# Patient Record
Sex: Female | Born: 1945 | Race: White | Hispanic: No | Marital: Single | Smoking: Never smoker
Health system: Southern US, Community
[De-identification: ages and names within clinical notes are randomized; demographics above are authoritative.]

## PROBLEM LIST (undated history)

## (undated) DIAGNOSIS — M797 Fibromyalgia: Secondary | ICD-10-CM

## (undated) DIAGNOSIS — J189 Pneumonia, unspecified organism: Secondary | ICD-10-CM

## (undated) DIAGNOSIS — M1990 Unspecified osteoarthritis, unspecified site: Secondary | ICD-10-CM

## (undated) DIAGNOSIS — E119 Type 2 diabetes mellitus without complications: Secondary | ICD-10-CM

## (undated) DIAGNOSIS — K589 Irritable bowel syndrome without diarrhea: Secondary | ICD-10-CM

## (undated) DIAGNOSIS — I10 Essential (primary) hypertension: Secondary | ICD-10-CM

## (undated) DIAGNOSIS — Z8719 Personal history of other diseases of the digestive system: Secondary | ICD-10-CM

## (undated) DIAGNOSIS — G4730 Sleep apnea, unspecified: Secondary | ICD-10-CM

## (undated) HISTORY — PX: ABDOMINAL HYSTERECTOMY: SHX81

## (undated) HISTORY — PX: TONSILLECTOMY: SUR1361

## (undated) HISTORY — PX: EYE SURGERY: SHX253

## (undated) HISTORY — PX: FRACTURE SURGERY: SHX138

## (undated) HISTORY — PX: COLONOSCOPY: SHX174

---

## 2007-04-10 DIAGNOSIS — E559 Vitamin D deficiency, unspecified: Secondary | ICD-10-CM | POA: Insufficient documentation

## 2007-12-09 DIAGNOSIS — I10 Essential (primary) hypertension: Secondary | ICD-10-CM | POA: Insufficient documentation

## 2007-12-09 DIAGNOSIS — M797 Fibromyalgia: Secondary | ICD-10-CM | POA: Insufficient documentation

## 2011-01-09 DIAGNOSIS — G4700 Insomnia, unspecified: Secondary | ICD-10-CM | POA: Insufficient documentation

## 2011-01-31 DIAGNOSIS — H538 Other visual disturbances: Secondary | ICD-10-CM | POA: Insufficient documentation

## 2011-01-31 DIAGNOSIS — H5340 Unspecified visual field defects: Secondary | ICD-10-CM | POA: Insufficient documentation

## 2011-08-30 DIAGNOSIS — E119 Type 2 diabetes mellitus without complications: Secondary | ICD-10-CM | POA: Insufficient documentation

## 2011-08-30 DIAGNOSIS — M5430 Sciatica, unspecified side: Secondary | ICD-10-CM | POA: Insufficient documentation

## 2011-08-30 DIAGNOSIS — L908 Other atrophic disorders of skin: Secondary | ICD-10-CM | POA: Insufficient documentation

## 2011-08-30 DIAGNOSIS — Z79899 Other long term (current) drug therapy: Secondary | ICD-10-CM | POA: Insufficient documentation

## 2011-11-02 DIAGNOSIS — I2510 Atherosclerotic heart disease of native coronary artery without angina pectoris: Secondary | ICD-10-CM | POA: Insufficient documentation

## 2011-11-02 DIAGNOSIS — I2584 Coronary atherosclerosis due to calcified coronary lesion: Secondary | ICD-10-CM

## 2012-01-02 DIAGNOSIS — I739 Peripheral vascular disease, unspecified: Secondary | ICD-10-CM

## 2012-01-02 DIAGNOSIS — F419 Anxiety disorder, unspecified: Secondary | ICD-10-CM | POA: Insufficient documentation

## 2012-01-02 DIAGNOSIS — E785 Hyperlipidemia, unspecified: Secondary | ICD-10-CM | POA: Insufficient documentation

## 2012-01-02 DIAGNOSIS — M419 Scoliosis, unspecified: Secondary | ICD-10-CM | POA: Insufficient documentation

## 2012-01-02 DIAGNOSIS — I779 Disorder of arteries and arterioles, unspecified: Secondary | ICD-10-CM | POA: Insufficient documentation

## 2012-06-15 DIAGNOSIS — M659 Synovitis and tenosynovitis, unspecified: Secondary | ICD-10-CM | POA: Insufficient documentation

## 2012-06-15 DIAGNOSIS — M109 Gout, unspecified: Secondary | ICD-10-CM | POA: Insufficient documentation

## 2012-07-16 DIAGNOSIS — M545 Low back pain, unspecified: Secondary | ICD-10-CM | POA: Insufficient documentation

## 2012-07-16 DIAGNOSIS — M5030 Other cervical disc degeneration, unspecified cervical region: Secondary | ICD-10-CM | POA: Insufficient documentation

## 2012-07-16 DIAGNOSIS — N289 Disorder of kidney and ureter, unspecified: Secondary | ICD-10-CM | POA: Insufficient documentation

## 2012-07-16 DIAGNOSIS — M542 Cervicalgia: Secondary | ICD-10-CM | POA: Insufficient documentation

## 2012-10-15 DIAGNOSIS — F1190 Opioid use, unspecified, uncomplicated: Secondary | ICD-10-CM | POA: Insufficient documentation

## 2012-10-15 DIAGNOSIS — M9979 Connective tissue and disc stenosis of intervertebral foramina of abdomen and other regions: Secondary | ICD-10-CM | POA: Insufficient documentation

## 2013-01-06 DIAGNOSIS — D649 Anemia, unspecified: Secondary | ICD-10-CM | POA: Insufficient documentation

## 2013-01-06 DIAGNOSIS — S065XAA Traumatic subdural hemorrhage with loss of consciousness status unknown, initial encounter: Secondary | ICD-10-CM | POA: Insufficient documentation

## 2013-01-06 DIAGNOSIS — S065X9A Traumatic subdural hemorrhage with loss of consciousness of unspecified duration, initial encounter: Secondary | ICD-10-CM | POA: Insufficient documentation

## 2013-02-09 DIAGNOSIS — M79673 Pain in unspecified foot: Secondary | ICD-10-CM | POA: Insufficient documentation

## 2013-03-09 DIAGNOSIS — M1990 Unspecified osteoarthritis, unspecified site: Secondary | ICD-10-CM | POA: Insufficient documentation

## 2013-05-06 DIAGNOSIS — M542 Cervicalgia: Secondary | ICD-10-CM | POA: Diagnosis not present

## 2013-05-06 DIAGNOSIS — D638 Anemia in other chronic diseases classified elsewhere: Secondary | ICD-10-CM | POA: Diagnosis not present

## 2013-05-06 DIAGNOSIS — Z79899 Other long term (current) drug therapy: Secondary | ICD-10-CM | POA: Diagnosis not present

## 2013-05-06 DIAGNOSIS — I2584 Coronary atherosclerosis due to calcified coronary lesion: Secondary | ICD-10-CM | POA: Diagnosis not present

## 2013-05-06 DIAGNOSIS — D509 Iron deficiency anemia, unspecified: Secondary | ICD-10-CM | POA: Diagnosis not present

## 2013-05-06 DIAGNOSIS — E119 Type 2 diabetes mellitus without complications: Secondary | ICD-10-CM | POA: Diagnosis not present

## 2013-05-06 DIAGNOSIS — M1990 Unspecified osteoarthritis, unspecified site: Secondary | ICD-10-CM | POA: Diagnosis not present

## 2013-05-06 DIAGNOSIS — G8929 Other chronic pain: Secondary | ICD-10-CM | POA: Diagnosis not present

## 2013-05-06 DIAGNOSIS — I129 Hypertensive chronic kidney disease with stage 1 through stage 4 chronic kidney disease, or unspecified chronic kidney disease: Secondary | ICD-10-CM | POA: Diagnosis not present

## 2013-05-06 DIAGNOSIS — E559 Vitamin D deficiency, unspecified: Secondary | ICD-10-CM | POA: Diagnosis not present

## 2013-05-06 DIAGNOSIS — M545 Low back pain, unspecified: Secondary | ICD-10-CM | POA: Diagnosis not present

## 2013-05-06 DIAGNOSIS — I779 Disorder of arteries and arterioles, unspecified: Secondary | ICD-10-CM | POA: Diagnosis not present

## 2013-05-06 DIAGNOSIS — N189 Chronic kidney disease, unspecified: Secondary | ICD-10-CM | POA: Diagnosis not present

## 2013-05-06 DIAGNOSIS — G4700 Insomnia, unspecified: Secondary | ICD-10-CM | POA: Diagnosis not present

## 2013-05-06 DIAGNOSIS — IMO0001 Reserved for inherently not codable concepts without codable children: Secondary | ICD-10-CM | POA: Diagnosis not present

## 2013-05-06 DIAGNOSIS — M109 Gout, unspecified: Secondary | ICD-10-CM | POA: Diagnosis not present

## 2013-05-06 DIAGNOSIS — I2510 Atherosclerotic heart disease of native coronary artery without angina pectoris: Secondary | ICD-10-CM | POA: Diagnosis not present

## 2013-05-13 DIAGNOSIS — E119 Type 2 diabetes mellitus without complications: Secondary | ICD-10-CM | POA: Diagnosis not present

## 2013-05-13 DIAGNOSIS — N182 Chronic kidney disease, stage 2 (mild): Secondary | ICD-10-CM | POA: Diagnosis not present

## 2013-05-29 DIAGNOSIS — E119 Type 2 diabetes mellitus without complications: Secondary | ICD-10-CM | POA: Diagnosis not present

## 2013-05-29 DIAGNOSIS — F329 Major depressive disorder, single episode, unspecified: Secondary | ICD-10-CM | POA: Diagnosis not present

## 2013-05-29 DIAGNOSIS — R109 Unspecified abdominal pain: Secondary | ICD-10-CM | POA: Diagnosis not present

## 2013-05-29 DIAGNOSIS — M109 Gout, unspecified: Secondary | ICD-10-CM | POA: Diagnosis not present

## 2013-05-29 DIAGNOSIS — Z79899 Other long term (current) drug therapy: Secondary | ICD-10-CM | POA: Diagnosis not present

## 2013-05-29 DIAGNOSIS — E785 Hyperlipidemia, unspecified: Secondary | ICD-10-CM | POA: Diagnosis not present

## 2013-05-29 DIAGNOSIS — F411 Generalized anxiety disorder: Secondary | ICD-10-CM | POA: Diagnosis not present

## 2013-05-29 DIAGNOSIS — F3289 Other specified depressive episodes: Secondary | ICD-10-CM | POA: Diagnosis not present

## 2013-05-29 DIAGNOSIS — N390 Urinary tract infection, site not specified: Secondary | ICD-10-CM | POA: Diagnosis not present

## 2013-05-29 DIAGNOSIS — K219 Gastro-esophageal reflux disease without esophagitis: Secondary | ICD-10-CM | POA: Diagnosis not present

## 2013-05-29 DIAGNOSIS — K5900 Constipation, unspecified: Secondary | ICD-10-CM | POA: Diagnosis not present

## 2013-05-29 DIAGNOSIS — G4733 Obstructive sleep apnea (adult) (pediatric): Secondary | ICD-10-CM | POA: Diagnosis not present

## 2013-05-29 DIAGNOSIS — R1032 Left lower quadrant pain: Secondary | ICD-10-CM | POA: Diagnosis not present

## 2013-05-29 DIAGNOSIS — E876 Hypokalemia: Secondary | ICD-10-CM | POA: Diagnosis not present

## 2013-06-01 DIAGNOSIS — G4700 Insomnia, unspecified: Secondary | ICD-10-CM | POA: Diagnosis not present

## 2013-06-01 DIAGNOSIS — E119 Type 2 diabetes mellitus without complications: Secondary | ICD-10-CM | POA: Diagnosis not present

## 2013-06-01 DIAGNOSIS — F411 Generalized anxiety disorder: Secondary | ICD-10-CM | POA: Diagnosis not present

## 2013-06-28 DIAGNOSIS — Z139 Encounter for screening, unspecified: Secondary | ICD-10-CM | POA: Diagnosis not present

## 2013-06-28 DIAGNOSIS — IMO0001 Reserved for inherently not codable concepts without codable children: Secondary | ICD-10-CM | POA: Diagnosis not present

## 2013-06-28 DIAGNOSIS — Z9181 History of falling: Secondary | ICD-10-CM | POA: Diagnosis not present

## 2013-06-28 DIAGNOSIS — M4120 Other idiopathic scoliosis, site unspecified: Secondary | ICD-10-CM | POA: Diagnosis not present

## 2013-06-28 DIAGNOSIS — R52 Pain, unspecified: Secondary | ICD-10-CM | POA: Diagnosis not present

## 2013-07-09 DIAGNOSIS — I779 Disorder of arteries and arterioles, unspecified: Secondary | ICD-10-CM | POA: Diagnosis not present

## 2013-07-09 DIAGNOSIS — E119 Type 2 diabetes mellitus without complications: Secondary | ICD-10-CM | POA: Diagnosis not present

## 2013-07-09 DIAGNOSIS — E785 Hyperlipidemia, unspecified: Secondary | ICD-10-CM | POA: Diagnosis not present

## 2013-07-09 DIAGNOSIS — I10 Essential (primary) hypertension: Secondary | ICD-10-CM | POA: Diagnosis not present

## 2013-07-09 DIAGNOSIS — Z79899 Other long term (current) drug therapy: Secondary | ICD-10-CM | POA: Diagnosis not present

## 2013-07-13 DIAGNOSIS — Z79899 Other long term (current) drug therapy: Secondary | ICD-10-CM | POA: Diagnosis not present

## 2013-07-13 DIAGNOSIS — R42 Dizziness and giddiness: Secondary | ICD-10-CM | POA: Diagnosis not present

## 2013-07-13 DIAGNOSIS — R269 Unspecified abnormalities of gait and mobility: Secondary | ICD-10-CM | POA: Diagnosis not present

## 2013-07-13 DIAGNOSIS — N189 Chronic kidney disease, unspecified: Secondary | ICD-10-CM | POA: Diagnosis not present

## 2013-07-13 DIAGNOSIS — G40209 Localization-related (focal) (partial) symptomatic epilepsy and epileptic syndromes with complex partial seizures, not intractable, without status epilepticus: Secondary | ICD-10-CM | POA: Diagnosis not present

## 2013-07-13 DIAGNOSIS — G40309 Generalized idiopathic epilepsy and epileptic syndromes, not intractable, without status epilepticus: Secondary | ICD-10-CM | POA: Diagnosis not present

## 2013-07-13 DIAGNOSIS — Z5181 Encounter for therapeutic drug level monitoring: Secondary | ICD-10-CM | POA: Diagnosis not present

## 2013-09-27 DIAGNOSIS — M109 Gout, unspecified: Secondary | ICD-10-CM | POA: Diagnosis not present

## 2013-09-27 DIAGNOSIS — R609 Edema, unspecified: Secondary | ICD-10-CM | POA: Diagnosis not present

## 2013-09-27 DIAGNOSIS — M1A00X0 Idiopathic chronic gout, unspecified site, without tophus (tophi): Secondary | ICD-10-CM | POA: Diagnosis not present

## 2013-09-27 DIAGNOSIS — F411 Generalized anxiety disorder: Secondary | ICD-10-CM | POA: Diagnosis not present

## 2013-09-27 DIAGNOSIS — G43909 Migraine, unspecified, not intractable, without status migrainosus: Secondary | ICD-10-CM | POA: Diagnosis not present

## 2013-09-27 DIAGNOSIS — I10 Essential (primary) hypertension: Secondary | ICD-10-CM | POA: Diagnosis not present

## 2013-09-27 DIAGNOSIS — Z79899 Other long term (current) drug therapy: Secondary | ICD-10-CM | POA: Diagnosis not present

## 2013-09-27 DIAGNOSIS — E785 Hyperlipidemia, unspecified: Secondary | ICD-10-CM | POA: Diagnosis not present

## 2013-09-27 DIAGNOSIS — N289 Disorder of kidney and ureter, unspecified: Secondary | ICD-10-CM | POA: Diagnosis not present

## 2013-09-27 DIAGNOSIS — E119 Type 2 diabetes mellitus without complications: Secondary | ICD-10-CM | POA: Diagnosis not present

## 2013-11-02 DIAGNOSIS — N63 Unspecified lump in unspecified breast: Secondary | ICD-10-CM | POA: Diagnosis not present

## 2013-11-02 DIAGNOSIS — N390 Urinary tract infection, site not specified: Secondary | ICD-10-CM | POA: Diagnosis not present

## 2013-11-03 DIAGNOSIS — N63 Unspecified lump in unspecified breast: Secondary | ICD-10-CM | POA: Diagnosis not present

## 2013-11-08 DIAGNOSIS — IMO0001 Reserved for inherently not codable concepts without codable children: Secondary | ICD-10-CM | POA: Diagnosis not present

## 2013-11-08 DIAGNOSIS — M545 Low back pain, unspecified: Secondary | ICD-10-CM | POA: Diagnosis not present

## 2013-11-08 DIAGNOSIS — M2550 Pain in unspecified joint: Secondary | ICD-10-CM | POA: Diagnosis not present

## 2013-11-08 DIAGNOSIS — M4120 Other idiopathic scoliosis, site unspecified: Secondary | ICD-10-CM | POA: Diagnosis not present

## 2013-12-07 DIAGNOSIS — M549 Dorsalgia, unspecified: Secondary | ICD-10-CM | POA: Diagnosis not present

## 2013-12-07 DIAGNOSIS — M4120 Other idiopathic scoliosis, site unspecified: Secondary | ICD-10-CM | POA: Diagnosis not present

## 2013-12-07 DIAGNOSIS — M545 Low back pain, unspecified: Secondary | ICD-10-CM | POA: Diagnosis not present

## 2013-12-07 DIAGNOSIS — IMO0001 Reserved for inherently not codable concepts without codable children: Secondary | ICD-10-CM | POA: Diagnosis not present

## 2014-01-03 DIAGNOSIS — I10 Essential (primary) hypertension: Secondary | ICD-10-CM | POA: Diagnosis not present

## 2014-01-03 DIAGNOSIS — F411 Generalized anxiety disorder: Secondary | ICD-10-CM | POA: Diagnosis not present

## 2014-01-03 DIAGNOSIS — E119 Type 2 diabetes mellitus without complications: Secondary | ICD-10-CM | POA: Diagnosis not present

## 2014-01-03 DIAGNOSIS — R609 Edema, unspecified: Secondary | ICD-10-CM | POA: Diagnosis not present

## 2014-01-03 DIAGNOSIS — Z23 Encounter for immunization: Secondary | ICD-10-CM | POA: Diagnosis not present

## 2014-01-06 DIAGNOSIS — M549 Dorsalgia, unspecified: Secondary | ICD-10-CM | POA: Diagnosis not present

## 2014-01-06 DIAGNOSIS — M797 Fibromyalgia: Secondary | ICD-10-CM | POA: Diagnosis not present

## 2014-01-06 DIAGNOSIS — M419 Scoliosis, unspecified: Secondary | ICD-10-CM | POA: Diagnosis not present

## 2014-01-06 DIAGNOSIS — M1000 Idiopathic gout, unspecified site: Secondary | ICD-10-CM | POA: Diagnosis not present

## 2014-03-04 DIAGNOSIS — M542 Cervicalgia: Secondary | ICD-10-CM | POA: Diagnosis not present

## 2014-03-04 DIAGNOSIS — M5030 Other cervical disc degeneration, unspecified cervical region: Secondary | ICD-10-CM | POA: Diagnosis not present

## 2014-03-04 DIAGNOSIS — Z79899 Other long term (current) drug therapy: Secondary | ICD-10-CM | POA: Diagnosis not present

## 2014-03-04 DIAGNOSIS — M545 Low back pain: Secondary | ICD-10-CM | POA: Diagnosis not present

## 2014-03-09 DIAGNOSIS — M40295 Other kyphosis, thoracolumbar region: Secondary | ICD-10-CM | POA: Diagnosis not present

## 2014-03-09 DIAGNOSIS — M419 Scoliosis, unspecified: Secondary | ICD-10-CM | POA: Diagnosis not present

## 2014-03-28 ENCOUNTER — Encounter: Payer: Self-pay | Admitting: Sports Medicine

## 2014-03-28 ENCOUNTER — Ambulatory Visit (INDEPENDENT_AMBULATORY_CARE_PROVIDER_SITE_OTHER): Payer: Medicare Other

## 2014-03-28 ENCOUNTER — Ambulatory Visit (INDEPENDENT_AMBULATORY_CARE_PROVIDER_SITE_OTHER): Payer: Medicare Other | Admitting: Sports Medicine

## 2014-03-28 VITALS — BP 132/85 | HR 79 | Ht 63.0 in | Wt 140.0 lb

## 2014-03-28 DIAGNOSIS — M5412 Radiculopathy, cervical region: Secondary | ICD-10-CM

## 2014-03-28 DIAGNOSIS — M47812 Spondylosis without myelopathy or radiculopathy, cervical region: Secondary | ICD-10-CM | POA: Diagnosis not present

## 2014-03-28 DIAGNOSIS — M4120 Other idiopathic scoliosis, site unspecified: Secondary | ICD-10-CM | POA: Insufficient documentation

## 2014-03-28 DIAGNOSIS — G8929 Other chronic pain: Secondary | ICD-10-CM

## 2014-03-28 DIAGNOSIS — M797 Fibromyalgia: Secondary | ICD-10-CM

## 2014-03-28 DIAGNOSIS — M549 Dorsalgia, unspecified: Secondary | ICD-10-CM | POA: Diagnosis not present

## 2014-03-28 DIAGNOSIS — M4186 Other forms of scoliosis, lumbar region: Secondary | ICD-10-CM | POA: Diagnosis not present

## 2014-03-28 DIAGNOSIS — M542 Cervicalgia: Secondary | ICD-10-CM | POA: Diagnosis not present

## 2014-03-28 DIAGNOSIS — M419 Scoliosis, unspecified: Secondary | ICD-10-CM

## 2014-03-28 MED ORDER — MELOXICAM 15 MG PO TABS: ORAL_TABLET | ORAL | Status: DC

## 2014-03-28 MED ORDER — DULOXETINE HCL 30 MG PO CPEP: 30.0000 mg | ORAL_CAPSULE | Freq: Every day | ORAL | Status: DC

## 2014-03-28 MED ORDER — PREDNISONE (PAK) 10 MG PO TABS: ORAL_TABLET | ORAL | Status: DC

## 2014-03-28 NOTE — Assessment & Plan Note (Addendum)
Sounds as though this is degenerative. She does have a DEXA scan coming up with her PCP. Treatment of osteoporosis per primary. She does have some thoracic and lumbar symptoms, we are going to obtain an x-ray and MRI in anticipation of intervention. She has failed physical therapy, steroids, narcotics for years.

## 2014-03-28 NOTE — Progress Notes (Signed)
   Subjective:    I'm seeing this patient as a consultation for:  Kristina Bravo, NP  CC: mid back pain  HPI: This is a pleasant 69 year old female with a relatively recent onset history of kyphoscoliosis.she's had pain for years that she localizes in the midline of the lower cervical spine, with radiation down the left arm in a C6 and C7 distribution. Pain is moderate, persistent, she has been given oxycodone by her primary provider, she did see a rheumatologist who was unhelpful, she was then referred to spine and scoliosis specialist where x-rays were done,and she is seeking a second opinion.  pain is moderate, persistent, she does have a history of fibromyalgia but is not on any specific medications.  Denies any lower extremity symptoms. No trauma.  She is not taking any anti-inflammatories. Prednisone has worked well in the past. She has had formal physical therapy which was ineffective. She does desire to do some aquatic therapy.  Past medical history, Surgical history, Family history not pertinant except as noted below, Social history, Allergies, and medications have been entered into the medical record, reviewed, and no changes needed.   Review of Systems: No headache, visual changes, nausea, vomiting, diarrhea, constipation, dizziness, abdominal pain, skin rash, fevers, chills, night sweats, weight loss, swollen lymph nodes, body aches, joint swelling, muscle aches, chest pain, shortness of breath, mood changes, visual or auditory hallucinations.   Objective:   General: Well Developed, well nourished, and in no acute distress.  Neuro/Psych: Alert and oriented x3, extra-ocular muscles intact, able to move all 4 extremities, sensation grossly intact. Skin: Warm and dry, no rashes noted.  Respiratory: Not using accessory muscles, speaking in full sentences, trachea midline.  Cardiovascular: Pulses palpable, no extremity edema. Abdomen: Does not appear distended. Neck: Negative  spurling's Full neck range of motion Grip strength and sensation normal in bilateral hands Strength good C4 to T1 distribution No sensory change to C4 to T1 Reflexes normal There is significant kyphoscoliosis with a right-sided rib hump.  Impression and Recommendations:   This case required medical decision making of moderate complexity.

## 2014-03-28 NOTE — Assessment & Plan Note (Signed)
She certainly does have severe scoliosis however her pain is likely related to lumbar spondylosis and degenerative disc disease. She does exhibit left C6 and C7 cervical radiculopathy, and most of her pain is in the lower cervical spine in the midline. She has already had physical therapy, steroids, narcotics which we are going to get her off of. There is also an element of myofascial pain syndrome. I'm going to do another course of prednisone, meloxicam,and we are going to obtain x-rays, and an MRI of her cervical, thoracic, and lumbar spine in anticipation of intervention. I would also like her to do some aquatic therapy. Return to go over MRI results for interventional planning.

## 2014-03-28 NOTE — Assessment & Plan Note (Signed)
Starting Cymbalta 

## 2014-03-30 DIAGNOSIS — M8580 Other specified disorders of bone density and structure, unspecified site: Secondary | ICD-10-CM | POA: Diagnosis not present

## 2014-03-30 DIAGNOSIS — Z13820 Encounter for screening for osteoporosis: Secondary | ICD-10-CM | POA: Diagnosis not present

## 2014-04-04 ENCOUNTER — Ambulatory Visit (INDEPENDENT_AMBULATORY_CARE_PROVIDER_SITE_OTHER): Payer: Medicare Other

## 2014-04-04 DIAGNOSIS — M4125 Other idiopathic scoliosis, thoracolumbar region: Secondary | ICD-10-CM

## 2014-04-04 DIAGNOSIS — M4120 Other idiopathic scoliosis, site unspecified: Secondary | ICD-10-CM

## 2014-04-04 DIAGNOSIS — M5124 Other intervertebral disc displacement, thoracic region: Secondary | ICD-10-CM | POA: Diagnosis not present

## 2014-04-04 DIAGNOSIS — M4185 Other forms of scoliosis, thoracolumbar region: Secondary | ICD-10-CM | POA: Diagnosis not present

## 2014-04-04 DIAGNOSIS — M5125 Other intervertebral disc displacement, thoracolumbar region: Secondary | ICD-10-CM

## 2014-04-04 DIAGNOSIS — M5412 Radiculopathy, cervical region: Secondary | ICD-10-CM

## 2014-04-04 DIAGNOSIS — M4124 Other idiopathic scoliosis, thoracic region: Secondary | ICD-10-CM

## 2014-04-04 DIAGNOSIS — M5134 Other intervertebral disc degeneration, thoracic region: Secondary | ICD-10-CM

## 2014-04-04 DIAGNOSIS — M4802 Spinal stenosis, cervical region: Secondary | ICD-10-CM | POA: Diagnosis not present

## 2014-04-04 DIAGNOSIS — M47816 Spondylosis without myelopathy or radiculopathy, lumbar region: Secondary | ICD-10-CM | POA: Diagnosis not present

## 2014-04-04 DIAGNOSIS — M47896 Other spondylosis, lumbar region: Secondary | ICD-10-CM

## 2014-04-04 DIAGNOSIS — M5023 Other cervical disc displacement, cervicothoracic region: Secondary | ICD-10-CM

## 2014-04-04 DIAGNOSIS — M19011 Primary osteoarthritis, right shoulder: Secondary | ICD-10-CM

## 2014-04-04 DIAGNOSIS — M2578 Osteophyte, vertebrae: Secondary | ICD-10-CM

## 2014-04-04 DIAGNOSIS — M25411 Effusion, right shoulder: Secondary | ICD-10-CM

## 2014-04-04 DIAGNOSIS — M47894 Other spondylosis, thoracic region: Secondary | ICD-10-CM

## 2014-04-05 DIAGNOSIS — M5030 Other cervical disc degeneration, unspecified cervical region: Secondary | ICD-10-CM | POA: Diagnosis not present

## 2014-04-05 DIAGNOSIS — M4120 Other idiopathic scoliosis, site unspecified: Secondary | ICD-10-CM | POA: Diagnosis not present

## 2014-04-05 DIAGNOSIS — M5440 Lumbago with sciatica, unspecified side: Secondary | ICD-10-CM | POA: Diagnosis not present

## 2014-04-05 DIAGNOSIS — M797 Fibromyalgia: Secondary | ICD-10-CM | POA: Diagnosis not present

## 2014-04-07 DIAGNOSIS — M4104 Infantile idiopathic scoliosis, thoracic region: Secondary | ICD-10-CM | POA: Diagnosis not present

## 2014-04-07 DIAGNOSIS — M5430 Sciatica, unspecified side: Secondary | ICD-10-CM | POA: Diagnosis not present

## 2014-04-07 DIAGNOSIS — E119 Type 2 diabetes mellitus without complications: Secondary | ICD-10-CM | POA: Diagnosis not present

## 2014-04-07 DIAGNOSIS — M5030 Other cervical disc degeneration, unspecified cervical region: Secondary | ICD-10-CM | POA: Diagnosis not present

## 2014-04-07 DIAGNOSIS — G894 Chronic pain syndrome: Secondary | ICD-10-CM | POA: Diagnosis not present

## 2014-04-07 DIAGNOSIS — M542 Cervicalgia: Secondary | ICD-10-CM | POA: Diagnosis not present

## 2014-04-08 ENCOUNTER — Ambulatory Visit: Payer: TRICARE For Life (TFL) | Admitting: Sports Medicine

## 2014-04-12 ENCOUNTER — Encounter: Payer: Self-pay | Admitting: Sports Medicine

## 2014-04-12 ENCOUNTER — Ambulatory Visit (INDEPENDENT_AMBULATORY_CARE_PROVIDER_SITE_OTHER): Payer: Medicare Other | Admitting: Sports Medicine

## 2014-04-12 VITALS — BP 142/85 | HR 94 | Ht 63.0 in | Wt 144.0 lb

## 2014-04-12 DIAGNOSIS — M5412 Radiculopathy, cervical region: Secondary | ICD-10-CM

## 2014-04-12 DIAGNOSIS — M47816 Spondylosis without myelopathy or radiculopathy, lumbar region: Secondary | ICD-10-CM | POA: Insufficient documentation

## 2014-04-12 DIAGNOSIS — Z13820 Encounter for screening for osteoporosis: Secondary | ICD-10-CM

## 2014-04-12 DIAGNOSIS — M797 Fibromyalgia: Secondary | ICD-10-CM | POA: Diagnosis not present

## 2014-04-12 DIAGNOSIS — M5416 Radiculopathy, lumbar region: Secondary | ICD-10-CM | POA: Diagnosis not present

## 2014-04-12 MED ORDER — VENLAFAXINE HCL ER 37.5 MG PO CP24: ORAL_CAPSULE | ORAL | Status: DC

## 2014-04-12 MED ORDER — VENLAFAXINE HCL ER 75 MG PO CP24: 75.0000 mg | ORAL_CAPSULE | Freq: Every day | ORAL | Status: DC

## 2014-04-12 NOTE — Progress Notes (Signed)
  Subjective:    CC: MRI results  HPI: This is a very pleasant 69 year old female with severe degenerative kyphoscoliosis. She has left-sided cervical radiculopathy. Unfortunately she has been through physical therapy and unfortunately had persistent symptoms.  Medications have also been ineffective. Her pain is localized in the left periscapular region, as well as into the left hand, second and third fingers. We obtained an MRI for interventional injection planning.  Left lumbar radiculopathy: She also has left-sided axial low back pain, she is unable to tell me whether it's worse with flexion, extension, and is unable to tell me palliating or precipitating factors. She does occasionally get radiation down the left leg, and S1 type distribution.  Fibromyalgia: She did get some nausea with Cymbalta.  Past medical history, Surgical history, Family history not pertinant except as noted below, Social history, Allergies, and medications have been entered into the medical record, reviewed, and no changes needed.   Review of Systems: No fevers, chills, night sweats, weight loss, chest pain, or shortness of breath.   Objective:    General: Well Developed, well nourished, and in no acute distress.  Neuro: Alert and oriented x3, extra-ocular muscles intact, sensation grossly intact.  HEENT: Normocephalic, atraumatic, pupils equal round reactive to light, neck supple, no masses, no lymphadenopathy, thyroid nonpalpable.  Skin: Warm and dry, no rashes. Cardiac: Regular rate and rhythm, no murmurs rubs or gallops, no lower extremity edema.  Respiratory: Clear to auscultation bilaterally. Not using accessory muscles, speaking in full sentences.  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 Her thoracic spine which shows a small and single disc protrusion. Lumbar spine shows severe scoliosis with degenerative facet arthritis,  she has multilevel lumbar degenerative disc disease. On further evaluation of her left S1 nerve root, its dural sac does appear to come in contact with a broad-based L5-S1 disc protrusion.  Impression and Recommendations:

## 2014-04-12 NOTE — Assessment & Plan Note (Signed)
Discontinue Cymbalta, excessive nausea.  We are going to switch to Effexor.

## 2014-04-12 NOTE — Assessment & Plan Note (Signed)
Cervical spine MRI does show multilevel disc protrusions with moderate central canal stenosis. She is experiencing left-sided C7 radiculopathy. At this point we are going to proceed with a left-sided C6-C7 interlaminar epidural. Next line return to see me 3 weeks after the injection to evaluate response.

## 2014-04-12 NOTE — Assessment & Plan Note (Signed)
The lumbar spine does have significant and multilevel spondylosis including severe facet arthritis with degenerative scoliosis. There is also multilevel degenerative disc disease. Symptoms do include left-sided axial back pain, it's difficult to determine whether this is disc related or facet-related, Kristina Shea is going to be very aware over the next several weeks as to what positions worsen her pain.  as she does get left-sided S1 distribution radiculopathy we are going to proceed also with a left-sided selective S1 nerve root block.  certainly her lower facets could also be pain generators, and if she does not get an adequate response from the initial epidural in the lumbar spine we will proceed with a  Lumbar facet injection.

## 2014-04-13 LAB — COMPREHENSIVE METABOLIC PANEL WITH GFR
Albumin: 4.2 g/dL (ref 3.5–5.2)
BUN: 28 mg/dL — ABNORMAL HIGH (ref 6–23)
CO2: 28 meq/L (ref 19–32)
Glucose, Bld: 74 mg/dL (ref 70–99)
Potassium: 3.6 meq/L (ref 3.5–5.3)
Sodium: 140 meq/L (ref 135–145)
Total Protein: 6.5 g/dL (ref 6.0–8.3)

## 2014-04-13 LAB — COMPREHENSIVE METABOLIC PANEL
ALT: <8 U/L (ref 0–35)
AST: 13 U/L (ref 0–37)
Alkaline Phosphatase: 74 U/L (ref 39–117)
Calcium: 9.2 mg/dL (ref 8.4–10.5)
Chloride: 102 mEq/L (ref 96–112)
Creat: 1.27 mg/dL — ABNORMAL HIGH (ref 0.50–1.10)
Total Bilirubin: 0.4 mg/dL (ref 0.2–1.2)

## 2014-04-13 LAB — CBC WITH DIFFERENTIAL/PLATELET
Basophils Absolute: 0 10*3/uL (ref 0.0–0.1)
Basophils Relative: 0 % (ref 0–1)
Eosinophils Absolute: 0.2 10*3/uL (ref 0.0–0.7)
Eosinophils Relative: 2 % (ref 0–5)
HCT: 39.6 % (ref 36.0–46.0)
Hemoglobin: 13.2 g/dL (ref 12.0–15.0)
Lymphocytes Relative: 31 % (ref 12–46)
Lymphs Abs: 3.3 K/uL (ref 0.7–4.0)
MCH: 30 pg (ref 26.0–34.0)
MCHC: 33.3 g/dL (ref 30.0–36.0)
MCV: 90 fL (ref 78.0–100.0)
MPV: 10.5 fL (ref 8.6–12.4)
Monocytes Absolute: 0.7 K/uL (ref 0.1–1.0)
Monocytes Relative: 7 % (ref 3–12)
Neutro Abs: 6.4 K/uL (ref 1.7–7.7)
Neutrophils Relative %: 60 % (ref 43–77)
Platelets: 245 10*3/uL (ref 150–400)
RBC: 4.4 MIL/uL (ref 3.87–5.11)
RDW: 14.8 % (ref 11.5–15.5)
WBC: 10.7 10*3/uL — ABNORMAL HIGH (ref 4.0–10.5)

## 2014-04-13 LAB — RHEUMATOID FACTOR: Rheumatoid fact SerPl-aCnc: <10 [IU]/mL (ref <=14)

## 2014-04-13 LAB — CK: Total CK: 42 U/L (ref 7–177)

## 2014-04-13 LAB — ANA: Anti Nuclear Antibody(ANA): NEGATIVE

## 2014-04-13 LAB — URIC ACID: Uric Acid, Serum: 5.5 mg/dL (ref 2.4–7.0)

## 2014-04-13 LAB — SEDIMENTATION RATE: Sed Rate: 1 mm/hr (ref 0–22)

## 2014-04-14 LAB — CYCLIC CITRUL PEPTIDE ANTIBODY, IGG: Cyclic Citrullin Peptide Ab: <2 U/mL (ref 0.0–5.0)

## 2014-04-20 ENCOUNTER — Ambulatory Visit (INDEPENDENT_AMBULATORY_CARE_PROVIDER_SITE_OTHER): Payer: Medicare Other

## 2014-04-20 ENCOUNTER — Ambulatory Visit: Payer: Self-pay | Admitting: Sports Medicine

## 2014-04-20 DIAGNOSIS — M810 Age-related osteoporosis without current pathological fracture: Secondary | ICD-10-CM

## 2014-04-21 ENCOUNTER — Ambulatory Visit (INDEPENDENT_AMBULATORY_CARE_PROVIDER_SITE_OTHER): Payer: Medicare Other | Admitting: Sports Medicine

## 2014-04-21 ENCOUNTER — Encounter: Payer: Self-pay | Admitting: Sports Medicine

## 2014-04-21 VITALS — BP 133/79 | HR 89 | Ht 62.5 in | Wt 143.0 lb

## 2014-04-21 DIAGNOSIS — M810 Age-related osteoporosis without current pathological fracture: Secondary | ICD-10-CM | POA: Insufficient documentation

## 2014-04-21 DIAGNOSIS — M5416 Radiculopathy, lumbar region: Secondary | ICD-10-CM | POA: Diagnosis not present

## 2014-04-21 DIAGNOSIS — M5412 Radiculopathy, cervical region: Secondary | ICD-10-CM

## 2014-04-21 MED ORDER — ALENDRONATE SODIUM 70 MG PO TABS: 70.0000 mg | ORAL_TABLET | ORAL | Status: DC

## 2014-04-21 NOTE — Assessment & Plan Note (Signed)
Also still awaiting cervical epidural.

## 2014-04-21 NOTE — Assessment & Plan Note (Signed)
Has not yet had epidural. We will proceed with a left L5-S1 interlaminar epidural.  Return 3 weeks after to evaluate response.

## 2014-04-21 NOTE — Addendum Note (Signed)
Addended by: Monica BectonHEKKEKANDAM, THOMAS J on: 04/21/2014 10:36 AM   Modules accepted: Orders

## 2014-04-21 NOTE — Progress Notes (Signed)
  Subjective:    CC: Follow-up  HPI: Neck pain: Unfortunately has not yet had her epidurals.  Low back pain: Has not yet had her epidural.  Bone density: Just finished her DEXA scan, she is positive for osteoporosis, amenable to start treatment.  Past medical history, Surgical history, Family history not pertinant except as noted below, Social history, Allergies, and medications have been entered into the medical record, reviewed, and no changes needed.   Review of Systems: No fevers, chills, night sweats, weight loss, chest pain, or shortness of breath.   Objective:    General: Well Developed, well nourished, and in no acute distress.  Neuro: Alert and oriented x3, extra-ocular muscles intact, sensation grossly intact.  HEENT: Normocephalic, atraumatic, pupils equal round reactive to light, neck supple, no masses, no lymphadenopathy, thyroid nonpalpable.  Skin: Warm and dry, no rashes. Cardiac: Regular rate and rhythm, no murmurs rubs or gallops, no lower extremity edema.  Respiratory: Clear to auscultation bilaterally. Not using accessory muscles, speaking in full sentences.  Impression and Recommendations:

## 2014-04-21 NOTE — Assessment & Plan Note (Signed)
Starting Fosamax. 

## 2014-04-25 ENCOUNTER — Ambulatory Visit
Admission: RE | Admit: 2014-04-25 | Discharge: 2014-04-25 | Disposition: A | Discharge status: Home / Self Care | Payer: Medicare Other | Source: Ambulatory Visit | Attending: Sports Medicine | Admitting: Sports Medicine

## 2014-04-25 DIAGNOSIS — M5412 Radiculopathy, cervical region: Secondary | ICD-10-CM | POA: Diagnosis not present

## 2014-04-25 DIAGNOSIS — M5022 Other cervical disc displacement, mid-cervical region: Secondary | ICD-10-CM | POA: Diagnosis not present

## 2014-04-25 MED ORDER — IOHEXOL 300 MG/ML  SOLN
1.0000 mL | Freq: Once | INTRAMUSCULAR | Status: AC | PRN
  Administered 2014-04-25: 1 mL via EPIDURAL

## 2014-04-25 MED ORDER — TRIAMCINOLONE ACETONIDE 40 MG/ML IJ SUSP (RADIOLOGY)
60.0000 mg | Freq: Once | INTRAMUSCULAR | Status: AC
  Administered 2014-04-25: 60 mg via EPIDURAL

## 2014-04-25 NOTE — Discharge Instructions (Signed)

## 2014-05-03 DIAGNOSIS — M542 Cervicalgia: Secondary | ICD-10-CM | POA: Diagnosis not present

## 2014-05-03 DIAGNOSIS — M4104 Infantile idiopathic scoliosis, thoracic region: Secondary | ICD-10-CM | POA: Diagnosis not present

## 2014-05-05 DIAGNOSIS — M4104 Infantile idiopathic scoliosis, thoracic region: Secondary | ICD-10-CM | POA: Diagnosis not present

## 2014-05-05 DIAGNOSIS — M542 Cervicalgia: Secondary | ICD-10-CM | POA: Diagnosis not present

## 2014-05-09 ENCOUNTER — Inpatient Hospital Stay: Admission: RE | Admit: 2014-05-09 | Payer: Medicare Other | Source: Ambulatory Visit

## 2014-05-12 ENCOUNTER — Ambulatory Visit: Payer: Medicare Other | Admitting: Sports Medicine

## 2014-05-12 ENCOUNTER — Ambulatory Visit
Admission: RE | Admit: 2014-05-12 | Discharge: 2014-05-12 | Disposition: A | Discharge status: Home / Self Care | Payer: Medicare Other | Source: Ambulatory Visit | Attending: Sports Medicine | Admitting: Sports Medicine

## 2014-05-12 DIAGNOSIS — M545 Low back pain: Secondary | ICD-10-CM | POA: Diagnosis not present

## 2014-05-12 MED ORDER — IOHEXOL 180 MG/ML  SOLN
2.0000 mL | Freq: Once | INTRAMUSCULAR | Status: AC | PRN
  Administered 2014-05-12: 2 mL via EPIDURAL

## 2014-05-12 MED ORDER — METHYLPREDNISOLONE ACETATE 40 MG/ML INJ SUSP (RADIOLOG
120.0000 mg | Freq: Once | INTRAMUSCULAR | Status: AC
  Administered 2014-05-12: 120 mg via EPIDURAL

## 2014-05-18 ENCOUNTER — Ambulatory Visit: Payer: Medicare Other | Admitting: Sports Medicine

## 2014-05-24 ENCOUNTER — Encounter: Payer: Self-pay | Admitting: Sports Medicine

## 2014-05-24 ENCOUNTER — Ambulatory Visit (INDEPENDENT_AMBULATORY_CARE_PROVIDER_SITE_OTHER): Payer: Medicare Other | Admitting: Sports Medicine

## 2014-05-24 VITALS — BP 143/86 | HR 93 | Ht 62.5 in | Wt 143.0 lb

## 2014-05-24 DIAGNOSIS — M542 Cervicalgia: Secondary | ICD-10-CM | POA: Diagnosis not present

## 2014-05-24 DIAGNOSIS — M5412 Radiculopathy, cervical region: Secondary | ICD-10-CM | POA: Diagnosis not present

## 2014-05-24 DIAGNOSIS — M1000 Idiopathic gout, unspecified site: Secondary | ICD-10-CM | POA: Diagnosis not present

## 2014-05-24 DIAGNOSIS — M5416 Radiculopathy, lumbar region: Secondary | ICD-10-CM | POA: Diagnosis not present

## 2014-05-24 DIAGNOSIS — M4104 Infantile idiopathic scoliosis, thoracic region: Secondary | ICD-10-CM | POA: Diagnosis not present

## 2014-05-24 MED ORDER — ALLOPURINOL 100 MG PO TABS: 100.0000 mg | ORAL_TABLET | Freq: Every day | ORAL | Status: DC

## 2014-05-24 NOTE — Progress Notes (Signed)
  Subjective:    CC:  Follow-up  HPI: Cervical spondylosis: Excellent response with complete pain relief after cervical epidural.  Lumbar spondylosis: No response, not even temporary after a left-sided L5-S1 interlaminar epidural, has persistent pain that she localizes predominantly on the left side with radiation into the posterior thigh. She did have significant and severe bilateral facet spondylosis on the MRI.  Gout: Recently had a flare, most recent uric acid levels were 5.5.  Past medical history, Surgical history, Family history not pertinant except as noted below, Social history, Allergies, and medications have been entered into the medical record, reviewed, and no changes needed.   Review of Systems: No fevers, chills, night sweats, weight loss, chest pain, or shortness of breath.   Objective:    General: Well Developed, well nourished, and in no acute distress.  Neuro: Alert and oriented x3, extra-ocular muscles intact, sensation grossly intact.  HEENT: Normocephalic, atraumatic, pupils equal round reactive to light, neck supple, no masses, no lymphadenopathy, thyroid nonpalpable.  Skin: Warm and dry, no rashes. Cardiac: Regular rate and rhythm, no murmurs rubs or gallops, no lower extremity edema.  Respiratory: Clear to auscultation bilaterally. Not using accessory muscles, speaking in full sentences.  On further review of the MRI we again note moderate to severe dextroscoliosis with moderate degenerative disc disease and L3-S1 facet spondylosis bilaterally, worse on the left side.  Impression and Recommendations:

## 2014-05-24 NOTE — Assessment & Plan Note (Signed)
Completely resolved with cervical epidural.

## 2014-05-24 NOTE — Assessment & Plan Note (Signed)
Lumbar epidural provided no response, not even temporary.  at this point we are going to proceed with multilevel facet injections. Pain is worse on the left side, we are going to do a left-sided L3-4, L4-5, and L5-S1 facet joint injections with special attention paid to concordant pain.

## 2014-05-24 NOTE — Assessment & Plan Note (Signed)
Adding low-dose allopurinol.

## 2014-06-01 ENCOUNTER — Other Ambulatory Visit: Payer: Medicare Other

## 2014-06-02 ENCOUNTER — Other Ambulatory Visit: Payer: Self-pay | Admitting: Sports Medicine

## 2014-06-02 ENCOUNTER — Ambulatory Visit
Admission: RE | Admit: 2014-06-02 | Discharge: 2014-06-02 | Disposition: A | Discharge status: Home / Self Care | Payer: Medicare Other | Source: Ambulatory Visit | Attending: Sports Medicine | Admitting: Sports Medicine

## 2014-06-02 DIAGNOSIS — M47817 Spondylosis without myelopathy or radiculopathy, lumbosacral region: Secondary | ICD-10-CM | POA: Diagnosis not present

## 2014-06-02 DIAGNOSIS — M5416 Radiculopathy, lumbar region: Secondary | ICD-10-CM

## 2014-06-02 MED ORDER — IOHEXOL 180 MG/ML  SOLN
1.0000 mL | Freq: Once | INTRAMUSCULAR | Status: AC | PRN
  Administered 2014-06-02: 1 mL via INTRA_ARTICULAR

## 2014-06-02 MED ORDER — METHYLPREDNISOLONE ACETATE 40 MG/ML INJ SUSP (RADIOLOG
120.0000 mg | Freq: Once | INTRAMUSCULAR | Status: AC
  Administered 2014-06-02: 120 mg via INTRA_ARTICULAR

## 2014-06-02 NOTE — Discharge Instructions (Signed)

## 2014-06-07 ENCOUNTER — Other Ambulatory Visit: Payer: Self-pay | Admitting: Sports Medicine

## 2014-06-16 ENCOUNTER — Ambulatory Visit: Payer: Medicare Other | Admitting: Sports Medicine

## 2014-06-20 ENCOUNTER — Encounter: Payer: Self-pay | Admitting: Sports Medicine

## 2014-06-20 ENCOUNTER — Ambulatory Visit (INDEPENDENT_AMBULATORY_CARE_PROVIDER_SITE_OTHER): Payer: Medicare Other | Admitting: Sports Medicine

## 2014-06-20 VITALS — BP 158/87 | HR 89 | Ht 62.5 in | Wt 144.0 lb

## 2014-06-20 DIAGNOSIS — M5412 Radiculopathy, cervical region: Secondary | ICD-10-CM

## 2014-06-20 DIAGNOSIS — M47816 Spondylosis without myelopathy or radiculopathy, lumbar region: Secondary | ICD-10-CM | POA: Diagnosis not present

## 2014-06-20 MED ORDER — MELOXICAM 15 MG PO TABS: ORAL_TABLET | ORAL | Status: DC

## 2014-06-20 NOTE — Assessment & Plan Note (Signed)
No response to lumbar epidural but did have a fantastic response with almost complete resolution of back pain L3-L4, L4-L5, and L5-S1 facet injections. According pain was noted primarily at the L3-L4 facet injection, she is now very happy with how her back and her neck feels, if she does have recurrence of pain she would be a candidate for an L3-L4 left-sided facet radio frequency ablation if she does well with medial branch blocks. At this point she has no pain so she can come back to see us on an as-needed basis. Injections were performed by Dr. Alfredo BattyMattern, we will use him for further injections in this patient.

## 2014-06-20 NOTE — Progress Notes (Signed)
  Subjective:    CC: Follow-up  HPI: Cervical radiculopathy: Resolved after her epidural.  Low back pain: No response to a lumbar epidural, more recently we proceeded with left-sided L3-L4, L4-L5, and L5-S1 facet injections, she had concordant pain with the left L3-L4 facet injections and reports near complete pain relief at this point, she is happy with results so far, and her meloxicam takes care of the rest of her pain.  Past medical history, Surgical history, Family history not pertinant except as noted below, Social history, Allergies, and medications have been entered into the medical record, reviewed, and no changes needed.   Review of Systems: No fevers, chills, night sweats, weight loss, chest pain, or shortness of breath.   Objective:    General: Well Developed, well nourished, and in no acute distress.  Neuro: Alert and oriented x3, extra-ocular muscles intact, sensation grossly intact.  HEENT: Normocephalic, atraumatic, pupils equal round reactive to light, neck supple, no masses, no lymphadenopathy, thyroid nonpalpable.  Skin: Warm and dry, no rashes. Cardiac: Regular rate and rhythm, no murmurs rubs or gallops, no lower extremity edema.  Respiratory: Clear to auscultation bilaterally. Not using accessory muscles, speaking in full sentences.  Impression and Recommendations:

## 2014-06-20 NOTE — Assessment & Plan Note (Signed)
Continues to be resolved after her epidural.

## 2014-07-12 DIAGNOSIS — I10 Essential (primary) hypertension: Secondary | ICD-10-CM | POA: Diagnosis not present

## 2014-07-12 DIAGNOSIS — E785 Hyperlipidemia, unspecified: Secondary | ICD-10-CM | POA: Diagnosis not present

## 2014-07-12 DIAGNOSIS — E119 Type 2 diabetes mellitus without complications: Secondary | ICD-10-CM | POA: Diagnosis not present

## 2014-07-12 DIAGNOSIS — G894 Chronic pain syndrome: Secondary | ICD-10-CM | POA: Diagnosis not present

## 2014-07-12 DIAGNOSIS — M5030 Other cervical disc degeneration, unspecified cervical region: Secondary | ICD-10-CM | POA: Diagnosis not present

## 2014-08-11 ENCOUNTER — Other Ambulatory Visit: Payer: Self-pay | Admitting: Sports Medicine

## 2014-10-14 ENCOUNTER — Other Ambulatory Visit: Payer: Self-pay | Admitting: Sports Medicine

## 2014-11-04 DIAGNOSIS — S42291A Other displaced fracture of upper end of right humerus, initial encounter for closed fracture: Secondary | ICD-10-CM | POA: Diagnosis not present

## 2014-11-04 DIAGNOSIS — K219 Gastro-esophageal reflux disease without esophagitis: Secondary | ICD-10-CM | POA: Diagnosis not present

## 2014-11-04 DIAGNOSIS — E785 Hyperlipidemia, unspecified: Secondary | ICD-10-CM | POA: Diagnosis not present

## 2014-11-04 DIAGNOSIS — Z79899 Other long term (current) drug therapy: Secondary | ICD-10-CM | POA: Diagnosis not present

## 2014-11-04 DIAGNOSIS — S42211A Unspecified displaced fracture of surgical neck of right humerus, initial encounter for closed fracture: Secondary | ICD-10-CM | POA: Diagnosis not present

## 2014-11-04 DIAGNOSIS — E119 Type 2 diabetes mellitus without complications: Secondary | ICD-10-CM | POA: Diagnosis not present

## 2014-11-04 DIAGNOSIS — F329 Major depressive disorder, single episode, unspecified: Secondary | ICD-10-CM | POA: Diagnosis not present

## 2014-11-04 DIAGNOSIS — W0110XA Fall on same level from slipping, tripping and stumbling with subsequent striking against unspecified object, initial encounter: Secondary | ICD-10-CM | POA: Diagnosis not present

## 2014-11-04 DIAGNOSIS — G4733 Obstructive sleep apnea (adult) (pediatric): Secondary | ICD-10-CM | POA: Diagnosis not present

## 2014-11-04 DIAGNOSIS — F419 Anxiety disorder, unspecified: Secondary | ICD-10-CM | POA: Diagnosis not present

## 2014-11-07 DIAGNOSIS — S42291A Other displaced fracture of upper end of right humerus, initial encounter for closed fracture: Secondary | ICD-10-CM | POA: Diagnosis not present

## 2014-11-09 DIAGNOSIS — S42209A Unspecified fracture of upper end of unspecified humerus, initial encounter for closed fracture: Secondary | ICD-10-CM | POA: Insufficient documentation

## 2014-11-14 DIAGNOSIS — S42291D Other displaced fracture of upper end of right humerus, subsequent encounter for fracture with routine healing: Secondary | ICD-10-CM | POA: Diagnosis not present

## 2014-11-16 ENCOUNTER — Encounter (HOSPITAL_COMMUNITY): Payer: Self-pay | Admitting: *Deleted

## 2014-11-16 ENCOUNTER — Encounter: Payer: Self-pay | Admitting: Sports Medicine

## 2014-11-16 ENCOUNTER — Ambulatory Visit (INDEPENDENT_AMBULATORY_CARE_PROVIDER_SITE_OTHER): Payer: Medicare Other | Admitting: Sports Medicine

## 2014-11-16 ENCOUNTER — Ambulatory Visit (INDEPENDENT_AMBULATORY_CARE_PROVIDER_SITE_OTHER): Payer: Medicare Other

## 2014-11-16 ENCOUNTER — Other Ambulatory Visit: Payer: Self-pay | Admitting: Orthopedic Surgery

## 2014-11-16 VITALS — BP 133/73 | HR 72 | Wt 178.0 lb

## 2014-11-16 DIAGNOSIS — M47816 Spondylosis without myelopathy or radiculopathy, lumbar region: Secondary | ICD-10-CM

## 2014-11-16 DIAGNOSIS — X58XXXA Exposure to other specified factors, initial encounter: Secondary | ICD-10-CM | POA: Diagnosis not present

## 2014-11-16 DIAGNOSIS — S42201A Unspecified fracture of upper end of right humerus, initial encounter for closed fracture: Secondary | ICD-10-CM | POA: Diagnosis not present

## 2014-11-16 DIAGNOSIS — S42291A Other displaced fracture of upper end of right humerus, initial encounter for closed fracture: Secondary | ICD-10-CM | POA: Diagnosis not present

## 2014-11-16 MED ORDER — CEFAZOLIN SODIUM-DEXTROSE 2-3 GM-% IV SOLR
2.0000 g | INTRAVENOUS | Status: AC
  Administered 2014-11-17: 2 g via INTRAVENOUS
  Filled 2014-11-16: qty 50

## 2014-11-16 MED ORDER — HYDROCODONE-ACETAMINOPHEN 10-325 MG PO TABS: 1.0000 | ORAL_TABLET | Freq: Three times a day (TID) | ORAL | Status: DC | PRN

## 2014-11-16 MED ORDER — POVIDONE-IODINE 7.5 % EX SOLN
Freq: Once | CUTANEOUS | Status: DC
  Filled 2014-11-16: qty 118

## 2014-11-16 NOTE — Progress Notes (Signed)
Pt denies any cardiac history, chest pain or sob. 

## 2014-11-16 NOTE — Progress Notes (Signed)
  Subjective:    CC: Right arm pain  HPI: This is a pleasant [age]-year-old female, 2 days ago she fell against the dresser and had immediate pain, swelling, bruising, she was seen in the emergency department where x-rays showed a displaced fracture of the proximal humerus, she then saw Dr. Dewaine Oats with [facility], he opted for conservative measures, she was not happy with her experience. She is here today for further evaluation and definitive treatment. Pain is severe, persistent, she is uncomfortable in the sling, and her pain medication is insufficient.  Past medical history, Surgical history, Family history not pertinant except as noted below, Social history, Allergies, and medications have been entered into the medical record, reviewed, and no changes needed.   Review of Systems: No fevers, chills, night sweats, weight loss, chest pain, or shortness of breath.   Objective:    General: Well Developed, well nourished, and in no acute distress.  Neuro: Alert and oriented x3, extra-ocular muscles intact, sensation grossly intact.  HEENT: Normocephalic, atraumatic, pupils equal round reactive to light, neck supple, no masses, no lymphadenopathy, thyroid nonpalpable.  Skin: Warm and dry, no rashes. Cardiac: Regular rate and rhythm, no murmurs rubs or gallops, no lower extremity edema.  Respiratory: Clear to auscultation bilaterally. Not using accessory muscles, speaking in full sentences. Right shoulder: Tender to palpation over the proximal humerus with significant bruising and swelling, and external rotation to approximately 1.  X-rays reviewed, they do show a 50% displaced proximal humeral fracture with impaction and severe angulation.  Impression and Recommendations:

## 2014-11-16 NOTE — Assessment & Plan Note (Addendum)
3 days post fracture, impacted and comminuted per radiologist report but I have no images to ascertain intra-articular component. We are going to repeat x-rays, and possibly a CT scan today. I'm going to switch her to a new sling, we will probably do 4-6 weeks of immobilization, followed by physical therapy to relieve the expected frozen shoulder. Increasing hydrocodone to 10/325. Return to see me in 3 weeks.  I billed a fracture code for this encounter, all subsequent visits will be post-op checks in the global period.  After discussion with Dr. Ave Filter we will proceed with operative intervention.

## 2014-11-16 NOTE — Assessment & Plan Note (Signed)
Fantastic response with complete resolution of back pain with L3-L4, L4-L5, and L5-S1 facet injections, concordant pain was most noted at the L3-L4 facet. This was approximately 6 months ago, we need to avoid any injections considering her current fracture but after her fracture is healed we can repeat facet injections. She prefers Dr. Alfredo Batty for injections.

## 2014-11-17 ENCOUNTER — Ambulatory Visit (HOSPITAL_COMMUNITY): Payer: Medicare Other

## 2014-11-17 ENCOUNTER — Ambulatory Visit (HOSPITAL_COMMUNITY): Payer: Medicare Other | Admitting: Anesthesiology

## 2014-11-17 ENCOUNTER — Observation Stay (HOSPITAL_COMMUNITY)
Admission: AD | Admit: 2014-11-17 | Discharge: 2014-11-18 | Disposition: A | Discharge status: Home / Self Care | Payer: Medicare Other | Source: Ambulatory Visit | Attending: Orthopedic Surgery | Admitting: Orthopedic Surgery

## 2014-11-17 ENCOUNTER — Encounter (HOSPITAL_COMMUNITY): Payer: Self-pay | Admitting: *Deleted

## 2014-11-17 ENCOUNTER — Encounter (HOSPITAL_COMMUNITY)
Admission: AD | Disposition: A | Discharge status: Home / Self Care | Payer: Self-pay | Source: Ambulatory Visit | Attending: Orthopedic Surgery

## 2014-11-17 DIAGNOSIS — M797 Fibromyalgia: Secondary | ICD-10-CM | POA: Insufficient documentation

## 2014-11-17 DIAGNOSIS — I10 Essential (primary) hypertension: Secondary | ICD-10-CM | POA: Insufficient documentation

## 2014-11-17 DIAGNOSIS — Z79899 Other long term (current) drug therapy: Secondary | ICD-10-CM | POA: Diagnosis not present

## 2014-11-17 DIAGNOSIS — Z9841 Cataract extraction status, right eye: Secondary | ICD-10-CM | POA: Insufficient documentation

## 2014-11-17 DIAGNOSIS — W19XXXA Unspecified fall, initial encounter: Secondary | ICD-10-CM | POA: Insufficient documentation

## 2014-11-17 DIAGNOSIS — Z825 Family history of asthma and other chronic lower respiratory diseases: Secondary | ICD-10-CM | POA: Insufficient documentation

## 2014-11-17 DIAGNOSIS — E119 Type 2 diabetes mellitus without complications: Secondary | ICD-10-CM | POA: Diagnosis not present

## 2014-11-17 DIAGNOSIS — K589 Irritable bowel syndrome without diarrhea: Secondary | ICD-10-CM | POA: Diagnosis not present

## 2014-11-17 DIAGNOSIS — M75101 Unspecified rotator cuff tear or rupture of right shoulder, not specified as traumatic: Secondary | ICD-10-CM | POA: Diagnosis not present

## 2014-11-17 DIAGNOSIS — Z9889 Other specified postprocedural states: Secondary | ICD-10-CM | POA: Diagnosis present

## 2014-11-17 DIAGNOSIS — M1990 Unspecified osteoarthritis, unspecified site: Secondary | ICD-10-CM | POA: Diagnosis not present

## 2014-11-17 DIAGNOSIS — S42211A Unspecified displaced fracture of surgical neck of right humerus, initial encounter for closed fracture: Principal | ICD-10-CM | POA: Insufficient documentation

## 2014-11-17 DIAGNOSIS — Z9842 Cataract extraction status, left eye: Secondary | ICD-10-CM | POA: Diagnosis not present

## 2014-11-17 DIAGNOSIS — S42201A Unspecified fracture of upper end of right humerus, initial encounter for closed fracture: Secondary | ICD-10-CM | POA: Diagnosis not present

## 2014-11-17 DIAGNOSIS — G8918 Other acute postprocedural pain: Secondary | ICD-10-CM | POA: Diagnosis not present

## 2014-11-17 DIAGNOSIS — Z8781 Personal history of (healed) traumatic fracture: Secondary | ICD-10-CM

## 2014-11-17 DIAGNOSIS — K449 Diaphragmatic hernia without obstruction or gangrene: Secondary | ICD-10-CM | POA: Insufficient documentation

## 2014-11-17 DIAGNOSIS — S42291A Other displaced fracture of upper end of right humerus, initial encounter for closed fracture: Secondary | ICD-10-CM | POA: Diagnosis not present

## 2014-11-17 DIAGNOSIS — Z419 Encounter for procedure for purposes other than remedying health state, unspecified: Secondary | ICD-10-CM

## 2014-11-17 HISTORY — DX: Sleep apnea, unspecified: G47.30

## 2014-11-17 HISTORY — DX: Fibromyalgia: M79.7

## 2014-11-17 HISTORY — DX: Irritable bowel syndrome, unspecified: K58.9

## 2014-11-17 HISTORY — DX: Pneumonia, unspecified organism: J18.9

## 2014-11-17 HISTORY — PX: ORIF HUMERUS FRACTURE: SHX2126

## 2014-11-17 HISTORY — DX: Unspecified osteoarthritis, unspecified site: M19.90

## 2014-11-17 HISTORY — DX: Type 2 diabetes mellitus without complications: E11.9

## 2014-11-17 HISTORY — DX: Personal history of other diseases of the digestive system: Z87.19

## 2014-11-17 HISTORY — DX: Essential (primary) hypertension: I10

## 2014-11-17 LAB — BASIC METABOLIC PANEL
ANION GAP: 11 (ref 5–15)
BUN: 19 mg/dL (ref 6–20)
CALCIUM: 9.2 mg/dL (ref 8.9–10.3)
CO2: 26 mmol/L (ref 22–32)
CREATININE: 1.22 mg/dL — AB (ref 0.44–1.00)
Chloride: 102 mmol/L (ref 101–111)
GFR calc Af Amer: 52 mL/min — ABNORMAL LOW (ref >60)
GFR, EST NON AFRICAN AMERICAN: 45 mL/min — AB (ref >60)
GLUCOSE: 102 mg/dL — AB (ref 65–99)
Potassium: 2.7 mmol/L — CL (ref 3.5–5.1)
Sodium: 139 mmol/L (ref 135–145)

## 2014-11-17 LAB — CBC
HCT: 32.7 % — ABNORMAL LOW (ref 36.0–46.0)
HEMOGLOBIN: 10.7 g/dL — AB (ref 12.0–15.0)
MCH: 30 pg (ref 26.0–34.0)
MCHC: 32.7 g/dL (ref 30.0–36.0)
MCV: 91.6 fL (ref 78.0–100.0)
PLATELETS: 323 10*3/uL (ref 150–400)
RBC: 3.57 MIL/uL — ABNORMAL LOW (ref 3.87–5.11)
RDW: 15.1 % (ref 11.5–15.5)
WBC: 5.3 10*3/uL (ref 4.0–10.5)

## 2014-11-17 LAB — GLUCOSE, CAPILLARY
GLUCOSE-CAPILLARY: 92 mg/dL (ref 65–99)
Glucose-Capillary: 110 mg/dL — ABNORMAL HIGH (ref 65–99)
Glucose-Capillary: 92 mg/dL (ref 65–99)

## 2014-11-17 SURGERY — OPEN REDUCTION INTERNAL FIXATION (ORIF) PROXIMAL HUMERUS FRACTURE
Anesthesia: General | Site: Shoulder | Laterality: Right

## 2014-11-17 MED ORDER — LIDOCAINE HCL (CARDIAC) 20 MG/ML IV SOLN
INTRAVENOUS | Status: DC | PRN
  Administered 2014-11-17: 50 mg via INTRAVENOUS

## 2014-11-17 MED ORDER — PRAVASTATIN SODIUM 20 MG PO TABS
20.0000 mg | ORAL_TABLET | Freq: Every day | ORAL | Status: DC
  Administered 2014-11-17: 20 mg via ORAL
  Filled 2014-11-17: qty 1

## 2014-11-17 MED ORDER — POTASSIUM CHLORIDE CRYS ER 20 MEQ PO TBCR
20.0000 meq | EXTENDED_RELEASE_TABLET | Freq: Two times a day (BID) | ORAL | Status: DC
  Administered 2014-11-17: 20 meq via ORAL
  Filled 2014-11-17: qty 1

## 2014-11-17 MED ORDER — VENLAFAXINE HCL ER 75 MG PO CP24
75.0000 mg | ORAL_CAPSULE | Freq: Every day | ORAL | Status: DC
  Administered 2014-11-18: 75 mg via ORAL
  Filled 2014-11-17: qty 1

## 2014-11-17 MED ORDER — FLEET ENEMA 7-19 GM/118ML RE ENEM: 1.0000 | ENEMA | Freq: Once | RECTAL | Status: DC | PRN

## 2014-11-17 MED ORDER — MENTHOL 3 MG MT LOZG: 1.0000 | LOZENGE | OROMUCOSAL | Status: DC | PRN

## 2014-11-17 MED ORDER — FENTANYL CITRATE (PF) 100 MCG/2ML IJ SOLN: 25.0000 ug | INTRAMUSCULAR | Status: DC | PRN

## 2014-11-17 MED ORDER — NEOSTIGMINE METHYLSULFATE 10 MG/10ML IV SOLN
INTRAVENOUS | Status: DC | PRN
  Administered 2014-11-17: 5 mg via INTRAVENOUS

## 2014-11-17 MED ORDER — POLYETHYLENE GLYCOL 3350 17 G PO PACK: 17.0000 g | PACK | Freq: Every day | ORAL | Status: DC | PRN

## 2014-11-17 MED ORDER — DIPHENHYDRAMINE HCL 12.5 MG/5ML PO ELIX: 12.5000 mg | ORAL_SOLUTION | ORAL | Status: DC | PRN

## 2014-11-17 MED ORDER — DEXTROSE 5 % IV SOLN
10.0000 mg | INTRAVENOUS | Status: DC | PRN
  Administered 2014-11-17: 5 ug/min via INTRAVENOUS

## 2014-11-17 MED ORDER — HYDROCHLOROTHIAZIDE 25 MG PO TABS: 12.5000 mg | ORAL_TABLET | Freq: Every day | ORAL | Status: DC

## 2014-11-17 MED ORDER — ZOLPIDEM TARTRATE 5 MG PO TABS: 5.0000 mg | ORAL_TABLET | Freq: Every evening | ORAL | Status: DC | PRN

## 2014-11-17 MED ORDER — MIDAZOLAM HCL 2 MG/2ML IJ SOLN
INTRAMUSCULAR | Status: AC
  Filled 2014-11-17: qty 4

## 2014-11-17 MED ORDER — GLIPIZIDE ER 2.5 MG PO TB24
2.5000 mg | ORAL_TABLET | Freq: Two times a day (BID) | ORAL | Status: DC
  Administered 2014-11-17 – 2014-11-18 (×2): 2.5 mg via ORAL
  Filled 2014-11-17 (×3): qty 1

## 2014-11-17 MED ORDER — ONDANSETRON HCL 4 MG/2ML IJ SOLN: 4.0000 mg | Freq: Once | INTRAMUSCULAR | Status: DC | PRN

## 2014-11-17 MED ORDER — ACETAMINOPHEN 650 MG RE SUPP: 650.0000 mg | Freq: Four times a day (QID) | RECTAL | Status: DC | PRN

## 2014-11-17 MED ORDER — ONDANSETRON HCL 4 MG/2ML IJ SOLN: 4.0000 mg | Freq: Four times a day (QID) | INTRAMUSCULAR | Status: DC | PRN

## 2014-11-17 MED ORDER — OXYCODONE HCL 5 MG PO TABS
5.0000 mg | ORAL_TABLET | ORAL | Status: DC | PRN
  Administered 2014-11-17 – 2014-11-18 (×4): 10 mg via ORAL
  Filled 2014-11-17 (×4): qty 2

## 2014-11-17 MED ORDER — ACETAMINOPHEN 325 MG PO TABS: 650.0000 mg | ORAL_TABLET | Freq: Four times a day (QID) | ORAL | Status: DC | PRN

## 2014-11-17 MED ORDER — OXYCODONE HCL 5 MG PO TABS: 5.0000 mg | ORAL_TABLET | Freq: Once | ORAL | Status: DC | PRN

## 2014-11-17 MED ORDER — CYCLOBENZAPRINE HCL 10 MG PO TABS: 10.0000 mg | ORAL_TABLET | Freq: Three times a day (TID) | ORAL | Status: DC | PRN

## 2014-11-17 MED ORDER — METFORMIN HCL 500 MG PO TABS: 500.0000 mg | ORAL_TABLET | Freq: Two times a day (BID) | ORAL | Status: DC

## 2014-11-17 MED ORDER — SUCCINYLCHOLINE CHLORIDE 20 MG/ML IJ SOLN
INTRAMUSCULAR | Status: DC | PRN
  Administered 2014-11-17: 100 mg via INTRAVENOUS

## 2014-11-17 MED ORDER — METOCLOPRAMIDE HCL 5 MG/ML IJ SOLN
5.0000 mg | Freq: Three times a day (TID) | INTRAMUSCULAR | Status: DC | PRN
Start: 2014-11-17 — End: 2014-11-18

## 2014-11-17 MED ORDER — ALLOPURINOL 100 MG PO TABS
100.0000 mg | ORAL_TABLET | Freq: Every day | ORAL | Status: DC
  Administered 2014-11-18: 100 mg via ORAL
  Filled 2014-11-17: qty 1

## 2014-11-17 MED ORDER — ASPIRIN EC 325 MG PO TBEC
325.0000 mg | DELAYED_RELEASE_TABLET | Freq: Two times a day (BID) | ORAL | Status: DC
  Administered 2014-11-18: 325 mg via ORAL
  Filled 2014-11-17: qty 1

## 2014-11-17 MED ORDER — ALUMINUM HYDROXIDE GEL 320 MG/5ML PO SUSP
15.0000 mL | ORAL | Status: DC | PRN
  Filled 2014-11-17: qty 30

## 2014-11-17 MED ORDER — LACTATED RINGERS IV SOLN
INTRAVENOUS | Status: AC
  Administered 2014-11-17: 1000 mL via INTRAVENOUS
  Filled 2014-11-17: qty 1000

## 2014-11-17 MED ORDER — PROPOFOL 10 MG/ML IV BOLUS
INTRAVENOUS | Status: DC | PRN
  Administered 2014-11-17: 140 mg via INTRAVENOUS

## 2014-11-17 MED ORDER — GLYCOPYRROLATE 0.2 MG/ML IJ SOLN
INTRAMUSCULAR | Status: DC | PRN
  Administered 2014-11-17: 0.6 mg via INTRAVENOUS

## 2014-11-17 MED ORDER — MORPHINE SULFATE (PF) 2 MG/ML IV SOLN: 2.0000 mg | INTRAVENOUS | Status: DC | PRN

## 2014-11-17 MED ORDER — FENTANYL CITRATE (PF) 100 MCG/2ML IJ SOLN
INTRAMUSCULAR | Status: AC
  Administered 2014-11-17: 50 ug
  Filled 2014-11-17: qty 2

## 2014-11-17 MED ORDER — FENTANYL CITRATE (PF) 250 MCG/5ML IJ SOLN
INTRAMUSCULAR | Status: AC
  Filled 2014-11-17: qty 5

## 2014-11-17 MED ORDER — CEFAZOLIN SODIUM 1-5 GM-% IV SOLN
1.0000 g | Freq: Four times a day (QID) | INTRAVENOUS | Status: AC
  Administered 2014-11-17 – 2014-11-18 (×2): 1 g via INTRAVENOUS
  Filled 2014-11-17 (×3): qty 50

## 2014-11-17 MED ORDER — MIDAZOLAM HCL 2 MG/2ML IJ SOLN
INTRAMUSCULAR | Status: AC
  Filled 2014-11-17: qty 2

## 2014-11-17 MED ORDER — ALENDRONATE SODIUM 70 MG PO TABS: 70.0000 mg | ORAL_TABLET | ORAL | Status: DC

## 2014-11-17 MED ORDER — LACTATED RINGERS IV SOLN
INTRAVENOUS | Status: DC
  Administered 2014-11-17: 13:00:00 via INTRAVENOUS

## 2014-11-17 MED ORDER — METOCLOPRAMIDE HCL 5 MG PO TABS: 5.0000 mg | ORAL_TABLET | Freq: Three times a day (TID) | ORAL | Status: DC | PRN

## 2014-11-17 MED ORDER — MIDAZOLAM HCL 2 MG/2ML IJ SOLN
2.0000 mg | Freq: Once | INTRAMUSCULAR | Status: AC
  Administered 2014-11-17: 0.5 mg via INTRAVENOUS

## 2014-11-17 MED ORDER — ROCURONIUM BROMIDE 100 MG/10ML IV SOLN
INTRAVENOUS | Status: DC | PRN
  Administered 2014-11-17: 30 mg via INTRAVENOUS

## 2014-11-17 MED ORDER — POTASSIUM CHLORIDE CRYS ER 20 MEQ PO TBCR: 20.0000 meq | EXTENDED_RELEASE_TABLET | Freq: Every day | ORAL | Status: DC

## 2014-11-17 MED ORDER — INSULIN ASPART 100 UNIT/ML ~~LOC~~ SOLN: 0.0000 [IU] | Freq: Three times a day (TID) | SUBCUTANEOUS | Status: DC

## 2014-11-17 MED ORDER — 0.9 % SODIUM CHLORIDE (POUR BTL) OPTIME
TOPICAL | Status: DC | PRN
  Administered 2014-11-17: 1000 mL

## 2014-11-17 MED ORDER — BISACODYL 5 MG PO TBEC: 5.0000 mg | DELAYED_RELEASE_TABLET | Freq: Every day | ORAL | Status: DC | PRN

## 2014-11-17 MED ORDER — DOCUSATE SODIUM 100 MG PO CAPS
100.0000 mg | ORAL_CAPSULE | Freq: Two times a day (BID) | ORAL | Status: DC
  Administered 2014-11-17 – 2014-11-18 (×2): 100 mg via ORAL
  Filled 2014-11-17 (×2): qty 1

## 2014-11-17 MED ORDER — ACETAMINOPHEN 500 MG PO TABS
1000.0000 mg | ORAL_TABLET | Freq: Four times a day (QID) | ORAL | Status: DC
  Administered 2014-11-18 (×3): 1000 mg via ORAL
  Filled 2014-11-17 (×3): qty 2

## 2014-11-17 MED ORDER — TOPIRAMATE 100 MG PO TABS
100.0000 mg | ORAL_TABLET | Freq: Two times a day (BID) | ORAL | Status: DC
  Administered 2014-11-17 – 2014-11-18 (×2): 100 mg via ORAL
  Filled 2014-11-17 (×2): qty 1

## 2014-11-17 MED ORDER — PHENOL 1.4 % MT LIQD: 1.0000 | OROMUCOSAL | Status: DC | PRN

## 2014-11-17 MED ORDER — ONDANSETRON HCL 4 MG PO TABS: 4.0000 mg | ORAL_TABLET | Freq: Four times a day (QID) | ORAL | Status: DC | PRN

## 2014-11-17 MED ORDER — OXYCODONE HCL 5 MG/5ML PO SOLN: 5.0000 mg | Freq: Once | ORAL | Status: DC | PRN

## 2014-11-17 MED ORDER — ONDANSETRON HCL 4 MG/2ML IJ SOLN
INTRAMUSCULAR | Status: DC | PRN
  Administered 2014-11-17: 4 mg via INTRAVENOUS

## 2014-11-17 MED ORDER — FENTANYL CITRATE (PF) 100 MCG/2ML IJ SOLN
100.0000 ug | Freq: Once | INTRAMUSCULAR | Status: AC
  Administered 2014-11-17: 50 ug via INTRAVENOUS

## 2014-11-17 MED ORDER — PROPOFOL 10 MG/ML IV BOLUS
INTRAVENOUS | Status: AC
  Filled 2014-11-17: qty 20

## 2014-11-17 MED ORDER — SODIUM CHLORIDE 0.9 % IV SOLN
INTRAVENOUS | Status: DC
  Administered 2014-11-17: 21:00:00 via INTRAVENOUS

## 2014-11-17 SURGICAL SUPPLY — 68 items
BIT DRILL 3.2 (BIT) ×4
BIT DRILL 3.2XCALB NS DISP (BIT) ×2 IMPLANT
BIT DRILL CALIBRATED 2.7 (BIT) ×2 IMPLANT
BIT DRILL CALIBRATED 2.7MM (BIT) ×1
BIT DRL 3.2XCALB NS DISP (BIT) ×2
CHLORAPREP W/TINT 26ML (MISCELLANEOUS) ×3 IMPLANT
CLOSURE WOUND 1/2 X4 (GAUZE/BANDAGES/DRESSINGS) ×1
COVER SURGICAL LIGHT HANDLE (MISCELLANEOUS) ×3 IMPLANT
DRAPE C-ARM 42X72 X-RAY (DRAPES) ×3 IMPLANT
DRAPE IMP U-DRAPE 54X76 (DRAPES) ×3 IMPLANT
DRAPE INCISE IOBAN 66X45 STRL (DRAPES) ×3 IMPLANT
DRAPE PROXIMA HALF (DRAPES) ×3 IMPLANT
DRAPE SURG 17X23 STRL (DRAPES) ×3 IMPLANT
DRAPE U-SHAPE 47X51 STRL (DRAPES) ×3 IMPLANT
DRSG AQUACEL AG ADV 3.5X10 (GAUZE/BANDAGES/DRESSINGS) ×3 IMPLANT
DRSG EMULSION OIL 3X3 NADH (GAUZE/BANDAGES/DRESSINGS) ×3 IMPLANT
DRSG MEPILEX BORDER 4X8 (GAUZE/BANDAGES/DRESSINGS) IMPLANT
DRSG PAD ABDOMINAL 8X10 ST (GAUZE/BANDAGES/DRESSINGS) ×3 IMPLANT
ELECT REM PT RETURN 9FT ADLT (ELECTROSURGICAL) ×3
ELECTRODE REM PT RTRN 9FT ADLT (ELECTROSURGICAL) ×1 IMPLANT
GAUZE SPONGE 4X4 12PLY STRL (GAUZE/BANDAGES/DRESSINGS) IMPLANT
GLOVE BIO SURGEON STRL SZ7 (GLOVE) ×3 IMPLANT
GLOVE BIO SURGEON STRL SZ7.5 (GLOVE) ×3 IMPLANT
GLOVE BIOGEL PI IND STRL 7.0 (GLOVE) ×1 IMPLANT
GLOVE BIOGEL PI IND STRL 8 (GLOVE) ×1 IMPLANT
GLOVE BIOGEL PI INDICATOR 7.0 (GLOVE) ×2
GLOVE BIOGEL PI INDICATOR 8 (GLOVE) ×2
GOWN STRL REUS W/ TWL LRG LVL3 (GOWN DISPOSABLE) ×3 IMPLANT
GOWN STRL REUS W/ TWL XL LVL3 (GOWN DISPOSABLE) ×1 IMPLANT
GOWN STRL REUS W/TWL LRG LVL3 (GOWN DISPOSABLE) ×6
GOWN STRL REUS W/TWL XL LVL3 (GOWN DISPOSABLE) ×2
K-WIRE 2X5 SS THRDED S3 (WIRE) ×3
KIT BASIN OR (CUSTOM PROCEDURE TRAY) ×3 IMPLANT
KIT ROOM TURNOVER OR (KITS) ×3 IMPLANT
KWIRE 2X5 SS THRDED S3 (WIRE) ×1 IMPLANT
MANIFOLD NEPTUNE II (INSTRUMENTS) ×3 IMPLANT
NDL SUT .5 MAYO 1.404X.05X (NEEDLE) ×1 IMPLANT
NDL SUT 2 .5 CRC MAYO 1.732X (NEEDLE) ×1 IMPLANT
NEEDLE 22X1 1/2 (OR ONLY) (NEEDLE) IMPLANT
NEEDLE MAYO TAPER (NEEDLE) ×4
NS IRRIG 1000ML POUR BTL (IV SOLUTION) ×3 IMPLANT
PACK SHOULDER (CUSTOM PROCEDURE TRAY) ×3 IMPLANT
PACK UNIVERSAL I (CUSTOM PROCEDURE TRAY) ×3 IMPLANT
PAD ARMBOARD 7.5X6 YLW CONV (MISCELLANEOUS) ×6 IMPLANT
PEG LOCKING 3.2MMX54 (Peg) ×3 IMPLANT
PEG LOCKING 3.2X34 (Screw) ×3 IMPLANT
PEG LOCKING 3.2X38 (Screw) ×3 IMPLANT
PEG LOCKING 3.2X40 (Peg) ×3 IMPLANT
PEG LOCKING 3.2X42 (Screw) ×6 IMPLANT
PLATE 3HOLE HUMERUS PROX RT (Plate) ×3 IMPLANT
SCREW LOW PROF TIS 3.5X28MM (Screw) ×3 IMPLANT
SCREW LP NL T15 3.5X24 (Screw) ×6 IMPLANT
SCREW LP NL T15 3.5X26 (Screw) ×3 IMPLANT
SLEEVE MEASURING 3.2 (BIT) ×3 IMPLANT
SPONGE LAP 18X18 X RAY DECT (DISPOSABLE) ×6 IMPLANT
STAPLER VISISTAT 35W (STAPLE) ×3 IMPLANT
STRIP CLOSURE SKIN 1/2X4 (GAUZE/BANDAGES/DRESSINGS) ×2 IMPLANT
SUCTION FRAZIER TIP 10 FR DISP (SUCTIONS) ×3 IMPLANT
SUPPORT WRAP ARM LG (MISCELLANEOUS) ×3 IMPLANT
SUT BONE WAX W31G (SUTURE) IMPLANT
SUT FIBERWIRE #2 38 T-5 BLUE (SUTURE) ×12
SUT VIC AB 2-0 CT1 27 (SUTURE) ×4
SUT VIC AB 2-0 CT1 TAPERPNT 27 (SUTURE) ×2 IMPLANT
SUTURE FIBERWR #2 38 T-5 BLUE (SUTURE) ×4 IMPLANT
SYR CONTROL 10ML LL (SYRINGE) IMPLANT
TOWEL OR 17X24 6PK STRL BLUE (TOWEL DISPOSABLE) ×3 IMPLANT
TOWEL OR 17X26 10 PK STRL BLUE (TOWEL DISPOSABLE) ×3 IMPLANT
YANKAUER SUCT BULB TIP NO VENT (SUCTIONS) ×3 IMPLANT

## 2014-11-17 NOTE — Op Note (Addendum)
Procedure(s): OPEN REDUCTION INTERNAL FIXATION (ORIF) PROXIMAL HUMERUS FRACTURE Procedure Note  Kristina Shea female 69 y.o. 11/17/2014  Procedure(s) and Anesthesia Type: #1 OPEN REDUCTION INTERNAL FIXATION (ORIF) PROXIMAL HUMERUS FRACTURE - Choice #2 Open rotator cuff repair R shoulder  Surgeon(s) and Role:    * Jones Broom, MD - Primary   Indications:  69 y.o. female s/p fall with right displaced proximal humerus fracture. Indicated for surgery to promote anatomic restoration anatomy, improve functional outcome.     Surgeon: Mable Paris   Assistants: Damita Lack PA-C [facility] was present and scrubbed throughout the procedure and was essential in positioning, retraction, exposure, and closure)  Anesthesia: General endotracheal anesthesia with preoperative interscalene block given by the attending anesthesiologist  Procedure Detail  OPEN REDUCTION INTERNAL FIXATION (ORIF) PROXIMAL HUMERUS FRACTURE  Findings: Near-anatomic alignment with Biomet S3 plate and locking pegs  Estimated Blood Loss:  400 mL         Drains: none  Blood Given: none         Specimens: none        Complications:  * No complications entered in OR log *         Disposition: PACU - hemodynamically stable.         Condition: stable    Procedure:   DESCRIPTION OF PROCEDURE: The patient was identified in preoperative  holding area where I personally marked the operative site after  verifying site, side, and procedure with the patient. The patient was taken back  to the operating room where general anesthesia was induced without  complication and was placed in the beach-chair position with the back  elevated about 40 degrees and all extremities carefully padded and  positioned. The right upper extremity was then prepped and  draped in a standard sterile fashion. The appropriate time-out  procedure was carried out. The patient did receive IV antibiotics  within 30  minutes of incision.  An incision was made from the coracoid tip to the center point of the humerus at the level of the exit [place] dissection was carried down through subcutaneous tissues to the deltopectoral interval identified by the cephalic vein which was taken laterally with the deltoid. The pectoralis major was retracted medially. The conjoined tendon was identified and a retractor was placed beneath it. The lateral deltoid was freed up from the proximal humerus. The fracture was then identified and carefully exposed. The long head biceps tendon was identified and traced into the rotator interval and then cut from its origin off of the superior labrum. It was then transected at the level of the pectoralis major. A #2 FiberWire was placed in the subscapularis for control of the lesser tuberosity. When trying to control the greater tuberosity it was realized that she had a medium size rotator cuff tear of the supraspinatus which measured about 2 cm anterior to posterior with retraction to the articular surface. This appeared to be a chronic finding likely present before her fracture. I did feel that this was a repairable tear and should be repaired concurrently with the fracture repair. Therefore I roughened the tuberosity with a small rongeur down to bleeding bone and repaired the tendon using 2 #2 FiberWire's in a locking suture configuration brought down through bone tunnels and out the lateral greater tuberosity tied over a bone bridge. This adequately repaired the tear completely. I then placed another #2 FiberWire in the posterior supraspinatus for control of the tuberosity. The fracture was then reduced using a bone hook and  Cobb elevator and reduction was verified with fluoroscopic imaging. The plate was laid lateral to the biceps groove and the central guidepin was placed and its position was verified with fluoroscopic imaging. A distal shaft screw was placed bringing the plate down to the shaft and  then proximally the 6 locking pegs were each placed carefully measuring and verifying with fluoroscopic imaging. The 2 distal shaft screws were then placed. The #2 fiber wires in the rotator cuff were then used to secure the repair to the plate as well. At this point final fluoroscopic imaging demonstrated near-anatomic reduction of the fracture. Copious irrigation was used and the wound was closed in layers using 2-0 Vicryls and 4-0 Monocryl for closure. A light sterile dressing was applied. The patient was then allowed to awaken from anesthesia transferred to the stretcher and taken to recovery room in stable condition. She was noted to have a fairly steady low-grade bloody ooze during the case therefore lost more blood than was initially expected. It is possible she may require postoperative transfusion as she started low.  POSTOPERATIVE PLAN: She will be kept overnight for pain control and  antibiotics, and will likely be discharged in the morning in a sling.  She will remain in the sling for about 4 weeks postoperatively with the  elbow, wrist, and hand motion only, and then will advance shoulder  motion once there is some healing seen on x-ray. We will recheck hemoglobin the morning and transfuse if necessary. She also was found to have low potassium preoperatively and was given some potassium intraoperatively by the anesthesia team. We will also consult the medicine team postoperatively to make any necessary adjustments to her medications.

## 2014-11-17 NOTE — Anesthesia Procedure Notes (Addendum)
Procedure Name: Intubation Date/Time: 11/17/2014 1:46 PM Performed by: Marylyn Ishihara Pre-anesthesia Checklist: Patient identified, Timeout performed, Emergency Drugs available, Suction available and Patient being monitored Patient Re-evaluated:Patient Re-evaluated prior to inductionOxygen Delivery Method: Circle system utilized Preoxygenation: Pre-oxygenation with 100% oxygen Intubation Type: IV induction Ventilation: Mask ventilation without difficulty Laryngoscope Size: Mac and 3 Grade View: Grade I Tube type: Oral Tube size: 7.5 mm Number of attempts: 1 Airway Equipment and Method: Stylet   Anesthesia Regional Block:  Supraclavicular block  Pre-Anesthetic Checklist: ,, timeout performed, Correct Patient, Correct Site, Correct Laterality, Correct Procedure, Correct Position, site marked, Risks and benefits discussed,  Surgical consent,  Pre-op evaluation,  At surgeon's request and post-op pain management  Laterality: Right  Prep: chloraprep       Needles:  Injection technique: Single-shot  Needle Type: Echogenic Stimulator Needle     Needle Length: 9cm 9 cm Needle Gauge: 21 and 21 G    Additional Needles:  Procedures: ultrasound guided (picture in chart) Supraclavicular block Narrative:  Start time: 11/17/2014 1:30 PM End time: 11/17/2014 1:40 PM Injection made incrementally with aspirations every 5 mL.  Performed by: Personally   Additional Notes: 20 cc 0.5%bupivacaine injected easily

## 2014-11-17 NOTE — Transfer of Care (Signed)
Immediate Anesthesia Transfer of Care Note  Patient: Kristina Shea  Procedure(s) Performed: Procedure(s) with comments: OPEN REDUCTION INTERNAL FIXATION (ORIF) PROXIMAL HUMERUS FRACTURE (Right) - Open reduction internal fixation proximal humerus fracture right shoulder  Patient Location: PACU  Anesthesia Type:GA combined with regional for post-op pain  Level of Consciousness: awake, alert , oriented, patient cooperative and responds to stimulation  Airway & Oxygen Therapy: Patient Spontanous Breathing and Patient connected to nasal cannula oxygen  Post-op Assessment: Report given to RN, Post -op Vital signs reviewed and stable and Patient moving all extremities X 4  Post vital signs: stable  Last Vitals:  Filed Vitals:   11/17/14 1300  BP:   Pulse: 74  Temp:   Resp: 13    Complications: No apparent anesthesia complications

## 2014-11-17 NOTE — Progress Notes (Addendum)
Kristina Shea in lab called with panic potassium of 2.7.  Mayme Genta, RN notified and she in turn notified Anesthesiologist, Dr. Kipp Brood.

## 2014-11-17 NOTE — H&P (Signed)
Kristina Shea is an 69 y.o. female.   Chief Complaint: R shoulder injury HPI: s/p Fall 2 wks ago with displaced R proximal humerus fracture.  Past Medical History  Diagnosis Date  . Hypertension   . Diabetes mellitus without complication     type 2  . Sleep apnea     many years ago, does not use cpap  . Pneumonia   . History of hiatal hernia   . Fibromyalgia   . Arthritis   . IBS (irritable bowel syndrome)     Past Surgical History  Procedure Laterality Date  . Eye surgery Bilateral     cataract surgery with lens implant  . Tonsillectomy    . Abdominal hysterectomy    . Fracture surgery      tendon surgery left little finger  . Colonoscopy      Family History  Problem Relation Age of Onset  . COPD Mother    Social History:  reports that she has never smoked. She has never used smokeless tobacco. She reports that she drinks alcohol. She reports that she does not use illicit drugs.  Allergies: No Known Allergies  Medications Prior to Admission  Medication Sig Dispense Refill  . alendronate (FOSAMAX) 70 MG tablet Take 1 tablet (70 mg total) by mouth every 7 (seven) days. 4 tablet 11  . allopurinol (ZYLOPRIM) 100 MG tablet Take 1 tablet (100 mg total) by mouth daily. 90 tablet 3  . cyclobenzaprine (FLEXERIL) 10 MG tablet Take 10 mg by mouth 3 (three) times daily as needed for muscle spasms. TAKE 1 TABLET THREE TIMES A DAY AS NEEDED FOR MUSCLE SPASM    . diazepam (VALIUM) 10 MG tablet Take 10 mg by mouth every 12 (twelve) hours as needed for sleep. TAKE 1 TABLET 3 TIMES DAILY    . Fe Cbn-Fe Gluc-FA-B12-C-DSS (FERRALET 90) 90-1 MG TABS Take 1 tablet by mouth 2 (two) times daily. TAKE 1 TABLET TWICE A DAY AS DIRECTED    . glipiZIDE (GLUCOTROL XL) 2.5 MG 24 hr tablet Take 2.5 mg by mouth 2 (two) times daily.     . hydrochlorothiazide (HYDRODIURIL) 12.5 MG tablet Take 12.5 mg by mouth daily.     Marland Kitchen HYDROcodone-acetaminophen (NORCO) 10-325 MG per tablet Take 1 tablet by mouth  every 8 (eight) hours as needed. 40 tablet 0  . meloxicam (MOBIC) 15 MG tablet One tab PO qAM with breakfast (Patient taking differently: Take 15 mg by mouth daily. ) 90 tablet 3  . metFORMIN (GLUCOPHAGE) 500 MG tablet Take 500 mg by mouth 2 (two) times daily with a meal.     . Misc. Devices (QUAD CANE) MISC 1 Device by Does not apply route.    . potassium chloride SA (KLOR-CON M20) 20 MEQ tablet Take 20 mEq by mouth daily.     . pravastatin (PRAVACHOL) 20 MG tablet Take 20 mg by mouth daily.     Marland Kitchen topiramate (TOPAMAX) 100 MG tablet Take 100 mg by mouth 2 (two) times daily.     Marland Kitchen venlafaxine XR (EFFEXOR-XR) 75 MG 24 hr capsule TAKE 1 CAPSULE (75 MG TOTAL) BY MOUTH DAILY WITH BREAKFAST. 30 capsule 1  . zolpidem (AMBIEN) 10 MG tablet TAKE 1 TABLET AT BEDTIME AS NEEDED FOR SLEEP      Results for orders placed or performed during the hospital encounter of 11/17/14 (from the past 48 hour(s))  Glucose, capillary     Status: Abnormal   Collection Time: 11/17/14 11:35 AM  Result Value  Ref Range   Glucose-Capillary 110 (H) 65 - 99 mg/dL  Basic metabolic panel     Status: Abnormal   Collection Time: 11/17/14 12:05 PM  Result Value Ref Range   Sodium 139 135 - 145 mmol/L   Potassium 2.7 (LL) 3.5 - 5.1 mmol/L    Comment: CRITICAL RESULT CALLED TO, READ BACK BY AND VERIFIED WITH: SAVAGE M RN 11/17/14 1251 COSTELLO B    Chloride 102 101 - 111 mmol/L   CO2 26 22 - 32 mmol/L   Glucose, Bld 102 (H) 65 - 99 mg/dL   BUN 19 6 - 20 mg/dL   Creatinine, Ser 1.22 (H) 0.44 - 1.00 mg/dL   Calcium 9.2 8.9 - 10.3 mg/dL   GFR calc non Af Amer 45 (L) >60 mL/min   GFR calc Af Amer 52 (L) >60 mL/min    Comment: (NOTE) The eGFR has been calculated using the CKD EPI equation. This calculation has not been validated in all clinical situations. eGFR's persistently <60 mL/min signify possible Chronic Kidney Disease.    Anion gap 11 5 - 15  CBC     Status: Abnormal   Collection Time: 11/17/14 12:05 PM  Result  Value Ref Range   WBC 5.3 4.0 - 10.5 K/uL   RBC 3.57 (L) 3.87 - 5.11 MIL/uL   Hemoglobin 10.7 (L) 12.0 - 15.0 g/dL   HCT 32.7 (L) 36.0 - 46.0 %   MCV 91.6 78.0 - 100.0 fL   MCH 30.0 26.0 - 34.0 pg   MCHC 32.7 30.0 - 36.0 g/dL   RDW 15.1 11.5 - 15.5 %   Platelets 323 150 - 400 K/uL   Dg Shoulder Right  11/16/2014   CLINICAL DATA:  Fracture of the humerus.  EXAM: RIGHT SHOULDER - 2+ VIEW  COMPARISON:  None.  FINDINGS: Comminuted fracture of the proximal metaphysis of the right humerus is present. Displaced fracture fragments and angulation deformity noted. Multiple right rib fractures are noted. These appear to be old. No pneumothorax.  IMPRESSION: Comminuted displaced angulated fracture of the proximal right humerus is present.   Electronically Signed   By: Marcello Moores  Register   On: 11/16/2014 10:07    Review of Systems  All other systems reviewed and are negative.   Blood pressure 120/60, pulse 80, temperature 98.5 F (36.9 C), temperature source Oral, resp. rate 18, height $RemoveBe'5\' 2"'IyKPTWqDU$  (1.575 m), weight 80.74 kg (178 lb), SpO2 95 %. Physical Exam  Constitutional: She is oriented to person, place, and time. She appears well-developed and well-nourished.  HENT:  Head: Atraumatic.  Eyes: EOM are normal.  Cardiovascular: Intact distal pulses.   Respiratory: Effort normal.  Musculoskeletal:  R shoulder pain with ROM, mild swelling  Neurological: She is alert and oriented to person, place, and time.  Skin: Skin is warm and dry.  Psychiatric: She has a normal mood and affect.     Assessment/Plan Displaced R proximal humerus fracture Plan ORIF today Risks / benefits of surgery discussed Consent on chart  NPO for OR Preop antibiotics   Anselm Aumiller WILLIAM 11/17/2014, 1:19 PM

## 2014-11-17 NOTE — Anesthesia Preprocedure Evaluation (Addendum)
Anesthesia Evaluation  Patient identified by MRN, date of birth, ID band Patient awake    Reviewed: Allergy & Precautions, NPO status , Patient's Chart, lab work & pertinent test results  Airway Mallampati: II  TM Distance: >3 FB Neck ROM: Full    Dental  (+) Teeth Intact, Dental Advisory Given   Pulmonary  breath sounds clear to auscultation        Cardiovascular hypertension, Rhythm:Regular Rate:Normal     Neuro/Psych    GI/Hepatic   Endo/Other  diabetes  Renal/GU      Musculoskeletal   Abdominal   Peds  Hematology   Anesthesia Other Findings   Reproductive/Obstetrics                           Anesthesia Physical Anesthesia Plan  ASA: II  Anesthesia Plan: General   Post-op Pain Management: MAC Combined w/ Regional for Post-op pain   Induction: Intravenous  Airway Management Planned: Oral ETT  Additional Equipment:   Intra-op Plan:   Post-operative Plan: Extubation in OR  Informed Consent: I have reviewed the patients History and Physical, chart, labs and discussed the procedure including the risks, benefits and alternatives for the proposed anesthesia with the patient or authorized representative who has indicated his/her understanding and acceptance.   Dental advisory given  Plan Discussed with: CRNA and Anesthesiologist  Anesthesia Plan Comments: (Hypokalemia K-2.7 Htn Type 2 DM glucose 102 H/O seizure disorder  Plan GA with interscalene block)       Anesthesia Quick Evaluation

## 2014-11-18 ENCOUNTER — Encounter (HOSPITAL_COMMUNITY): Payer: Self-pay | Admitting: Orthopedic Surgery

## 2014-11-18 DIAGNOSIS — K449 Diaphragmatic hernia without obstruction or gangrene: Secondary | ICD-10-CM | POA: Diagnosis not present

## 2014-11-18 DIAGNOSIS — E876 Hypokalemia: Secondary | ICD-10-CM

## 2014-11-18 DIAGNOSIS — E119 Type 2 diabetes mellitus without complications: Secondary | ICD-10-CM | POA: Diagnosis not present

## 2014-11-18 DIAGNOSIS — M797 Fibromyalgia: Secondary | ICD-10-CM | POA: Diagnosis not present

## 2014-11-18 DIAGNOSIS — I10 Essential (primary) hypertension: Secondary | ICD-10-CM | POA: Diagnosis not present

## 2014-11-18 DIAGNOSIS — M75101 Unspecified rotator cuff tear or rupture of right shoulder, not specified as traumatic: Secondary | ICD-10-CM | POA: Diagnosis not present

## 2014-11-18 DIAGNOSIS — S42211A Unspecified displaced fracture of surgical neck of right humerus, initial encounter for closed fracture: Secondary | ICD-10-CM | POA: Diagnosis not present

## 2014-11-18 DIAGNOSIS — S42201D Unspecified fracture of upper end of right humerus, subsequent encounter for fracture with routine healing: Secondary | ICD-10-CM | POA: Diagnosis not present

## 2014-11-18 LAB — CBC
HCT: 30.2 % — ABNORMAL LOW (ref 36.0–46.0)
HEMOGLOBIN: 9.6 g/dL — AB (ref 12.0–15.0)
MCH: 29.4 pg (ref 26.0–34.0)
MCHC: 31.8 g/dL (ref 30.0–36.0)
MCV: 92.4 fL (ref 78.0–100.0)
PLATELETS: 291 10*3/uL (ref 150–400)
RBC: 3.27 MIL/uL — AB (ref 3.87–5.11)
RDW: 15.4 % (ref 11.5–15.5)
WBC: 6.6 10*3/uL (ref 4.0–10.5)

## 2014-11-18 LAB — COMPREHENSIVE METABOLIC PANEL
ALT: 6 U/L — AB (ref 14–54)
AST: 14 U/L — AB (ref 15–41)
Albumin: 2.7 g/dL — ABNORMAL LOW (ref 3.5–5.0)
Alkaline Phosphatase: 101 U/L (ref 38–126)
Anion gap: 8 (ref 5–15)
BUN: 12 mg/dL (ref 6–20)
CHLORIDE: 103 mmol/L (ref 101–111)
CO2: 26 mmol/L (ref 22–32)
CREATININE: 1 mg/dL (ref 0.44–1.00)
Calcium: 8 mg/dL — ABNORMAL LOW (ref 8.9–10.3)
GFR calc Af Amer: >60 mL/min (ref >60)
GFR, EST NON AFRICAN AMERICAN: 57 mL/min — AB (ref >60)
Glucose, Bld: 112 mg/dL — ABNORMAL HIGH (ref 65–99)
Potassium: 2.8 mmol/L — ABNORMAL LOW (ref 3.5–5.1)
SODIUM: 137 mmol/L (ref 135–145)
Total Bilirubin: 0.5 mg/dL (ref 0.3–1.2)
Total Protein: 5.1 g/dL — ABNORMAL LOW (ref 6.5–8.1)

## 2014-11-18 LAB — GLUCOSE, CAPILLARY: Glucose-Capillary: 124 mg/dL — ABNORMAL HIGH (ref 65–99)

## 2014-11-18 LAB — MAGNESIUM: Magnesium: 1.3 mg/dL — ABNORMAL LOW (ref 1.7–2.4)

## 2014-11-18 MED ORDER — POTASSIUM CHLORIDE CRYS ER 20 MEQ PO TBCR: 40.0000 meq | EXTENDED_RELEASE_TABLET | Freq: Every day | ORAL | Status: AC

## 2014-11-18 MED ORDER — DOCUSATE SODIUM 100 MG PO CAPS: 100.0000 mg | ORAL_CAPSULE | Freq: Three times a day (TID) | ORAL | Status: DC | PRN

## 2014-11-18 MED ORDER — POTASSIUM CHLORIDE CRYS ER 20 MEQ PO TBCR: 40.0000 meq | EXTENDED_RELEASE_TABLET | Freq: Two times a day (BID) | ORAL | Status: DC

## 2014-11-18 MED ORDER — TRIAMTERENE-HCTZ 37.5-25 MG PO TABS
0.5000 | ORAL_TABLET | Freq: Every day | ORAL | Status: DC
  Administered 2014-11-18: 0.5 via ORAL
  Filled 2014-11-18: qty 0.5

## 2014-11-18 MED ORDER — TRIAMTERENE-HCTZ 37.5-25 MG PO TABS: 0.5000 | ORAL_TABLET | Freq: Every day | ORAL | Status: DC

## 2014-11-18 MED ORDER — POTASSIUM CHLORIDE CRYS ER 20 MEQ PO TBCR
40.0000 meq | EXTENDED_RELEASE_TABLET | ORAL | Status: AC
  Administered 2014-11-18: 40 meq via ORAL
  Filled 2014-11-18: qty 2

## 2014-11-18 MED ORDER — MAGNESIUM SULFATE 2 GM/50ML IV SOLN
2.0000 g | Freq: Once | INTRAVENOUS | Status: AC
  Administered 2014-11-18: 2 g via INTRAVENOUS
  Filled 2014-11-18: qty 50

## 2014-11-18 MED ORDER — OXYCODONE-ACETAMINOPHEN 5-325 MG PO TABS: 1.0000 | ORAL_TABLET | ORAL | Status: DC | PRN

## 2014-11-18 MED ORDER — POTASSIUM CHLORIDE CRYS ER 20 MEQ PO TBCR: 40.0000 meq | EXTENDED_RELEASE_TABLET | Freq: Every day | ORAL | Status: DC

## 2014-11-18 NOTE — Evaluation (Signed)
Occupational Therapy Evaluation Patient Details Name: Kristina Shea MRN: [id number] DOB: 1946/02/25 Today's Date: 11/18/2014    History of Present Illness 69 y/o female with PMH significant for HTN, diabetes type 2, depression and HLD; admitted due to Right shoulder pain. Patient fell 2 weeks prior to admission and was found to have displaced right proximal humerus fracture. She is status post ORIF and orthopedic service has consulted [facility] for assistance managing medical conditions and electrolytes repletion. Patient with hypokalemia.   Clinical Impression   Patient presenting with decreased ADL, IADL, functional mobility independence secondary to immobilization of RUE-shoulder. Patient independent PTA. Patient currently requires up to max assist with UB ADLs and up to mod assist with LB ADLs. Patient will benefit from acute OT to increase overall independence in the areas of ADLs, functional mobility, education on shoulder restrictions/limitations and overall safety in order to safely discharge home.     Follow Up Recommendations  No OT follow up;Supervision/Assistance - 24 hour    Equipment Recommendations  3 in 1 bedside comode    Recommendations for Other Services  None at this time  Precautions / Restrictions Precautions Precautions: Shoulder;Fall Type of Shoulder Precautions: No A/PROM > right shoulder Shoulder Interventions: Shoulder sling/immobilizer;Off for dressing/bathing/exercises Required Braces or Orthoses: Sling Restrictions Weight Bearing Restrictions: Yes RUE Weight Bearing: Non weight bearing   Mobility Bed Mobility Overal bed mobility: Needs Assistance Bed Mobility: Supine to Sit     Supine to sit: Supervision     General bed mobility comments: HOB flat, minimal use of bed rails  Transfers Overall transfer level: Needs assistance Equipment used: None Transfers: Sit to/from Stand Sit to Stand: Supervision General transfer comment: Supervision for  safety    Balance Overall balance assessment: Needs assistance Sitting-balance support: No upper extremity supported;Feet supported Sitting balance-Leahy Scale: Good     Standing balance support: During functional activity;No upper extremity supported Standing balance-Leahy Scale: Fair    ADL Overall ADL's : Needs assistance/impaired Eating/Feeding: Set up;Sitting   Grooming: Set up;Sitting   Upper Body Bathing: Moderate assistance;Sitting   Lower Body Bathing: Sit to/from stand;Moderate assistance   Upper Body Dressing : Maximal assistance;Sitting   Lower Body Dressing: Sit to/from stand;Moderate assistance   Toilet Transfer: Supervision/safety;BSC;Ambulation   Toileting- Clothing Manipulation and Hygiene: Supervision/safety;Sit to/from stand       Functional mobility during ADLs: Supervision/safety General ADL Comments: Pt requries assistance with UB/LB ADLs due to immobilization of RUE-shoulder. Pt will benefti from assistance post acute d/c, pt's friend states she can assist.     Pertinent Vitals/Pain Pain Assessment: 0-10 Pain Score: 8  Pain Location: right shoulder Pain Descriptors / Indicators: Aching;Throbbing Pain Intervention(s): Monitored during session;Repositioned     Hand Dominance Right   Extremity/Trunk Assessment Upper Extremity Assessment Upper Extremity Assessment: RUE deficits/detail RUE Deficits / Details: recent surgery RUE: Unable to fully assess due to immobilization   Lower Extremity Assessment Lower Extremity Assessment: Overall WFL for tasks assessed   Cervical / Trunk Assessment Cervical / Trunk Assessment: Normal   Communication Communication Communication: No difficulties   Cognition Arousal/Alertness: Awake/alert Behavior During Therapy: WFL for tasks assessed/performed Overall Cognitive Status: Within Functional Limits for tasks assessed         Shoulder Instructions Shoulder Instructions Donning/doffing shirt without  moving shoulder: Supervision/safety;Patient able to independently direct caregiver Method for sponge bathing under operated UE: Supervision/safety;Patient able to independently direct caregiver Donning/doffing sling/immobilizer: Minimal assistance;Patient able to independently direct caregiver Pendulum exercises (written home exercise program):  (n/a) ROM for  elbow, wrist and digits of operated UE: Supervision/safety;Patient able to independently direct caregiver Sling wearing schedule (on at all times/off for ADL's): Supervision/safety;Patient able to independently direct caregiver Proper positioning of operated UE when showering: Supervision/safety;Patient able to independently direct caregiver Dressing change:  (n/a) Positioning of UE while sleeping: Minimal assistance;Patient able to independently direct caregiver    Home Living Family/patient expects to be discharged to:: Private residence Living Arrangements: Alone Available Help at Discharge: Friend(s);Available PRN/intermittently Type of Home: House Home Access: Stairs to enter [employer] of Steps: 0 entering garage   Home Layout: One level     Bathroom Shower/Tub: Walk-in Pensions consultant: Standard     Home Equipment: Shower seat - built in   Prior Functioning/Environment Level of Independence: Independent     OT Diagnosis: Generalized weakness;Acute pain   OT Problem List: Decreased strength;Decreased range of motion;Decreased activity tolerance;Impaired balance (sitting and/or standing);Pain;Decreased safety awareness;Decreased knowledge of use of DME or AE;Decreased knowledge of precautions;Impaired UE functional use   OT Treatment/Interventions: Self-care/ADL training;Therapeutic exercise;Energy conservation;DME and/or AE instruction;Therapeutic activities;Patient/family education;Balance training    OT Goals(Current goals can be found in the care plan section) Acute Rehab OT  Goals Patient Stated Goal: go home soon OT Goal Formulation: With patient Time For Goal Achievement: 12/02/14 Potential to Achieve Goals: Good ADL Goals Pt Will Perform Grooming: with modified independence;standing Pt Will Perform Upper Body Bathing: sitting;with min assist Pt Will Perform Upper Body Dressing: with set-up;sitting Pt Will Transfer to Toilet: with modified independence;ambulating;bedside commode Pt/caregiver will Perform Home Exercise Program: Increased ROM;Increased strength;With Supervision;With written HEP provided (elbow, wrist, hand ONLY) Additional ADL Goal #1: Pt will independently don/doff sling without moving shoulder 100% of the time  OT Frequency: Min 3X/week   Barriers to D/C: Decreased caregiver support   End of Session Activity Tolerance: Patient tolerated treatment well Patient left: in chair;with call bell/phone within reach;with family/visitor present   Time: 1610-9604 OT Time Calculation (min): 43 min Charges:  OT General Charges $OT Visit: 1 Procedure OT Evaluation $Initial OT Evaluation Tier I: 1 Procedure OT Treatments $Self Care/Home Management : 8-22 mins $Therapeutic Exercise: 8-22 mins G-Codes: OT G-codes **NOT FOR INPATIENT CLASS** Functional Limitation: Self care Self Care Current Status (V4098): At least 40 percent but less than 60 percent impaired, limited or restricted Self Care Goal Status (J1914): At least 1 percent but less than 20 percent impaired, limited or restricted  Koren Sermersheim , MS, OTR/L, CLT Pager: [phone]  11/18/2014, 1:04 PM

## 2014-11-18 NOTE — Discharge Instructions (Signed)
Discharge Instructions after Open Shoulder Repair  A sling has been provided for you. Remain in your sling at all times. This includes sleeping in your sling.  Use ice on the shoulder intermittently over the first 48 hours after surgery.  Pain medicine has been prescribed for you.  Use your medicine liberally over the first 48 hours, and then you can begin to taper your use. You may take Extra Strength Tylenol or Tylenol only in place of the pain pills. DO NOT take ANY nonsteroidal anti-inflammatory pain medications: Advil, Motrin, Ibuprofen, Aleve, Naproxen or Naprosyn.  Keep dressing on until your first visit with Dr. Ave Filter, it is waterproof and you can shower with it. Just hold your arm by your side like it is in a sling.  Make sure your axilla (armpit) is completely dry after showering.  Take one aspirin, a day for 2 weeks after surgery, unless you have an aspirin sensitivity/ allergy or asthma.   Please call [phone] during normal business hours or [phone] after hours for any problems. Including the following:  - excessive redness of the incisions - drainage for more than 4 days - fever of more than 101.5 F  *Please note that pain medications will not be refilled after hours or on weekends.

## 2014-11-18 NOTE — Progress Notes (Signed)
   PATIENT ID: Kristina Shea   1 Day Post-Op Procedure(s) (LRB): OPEN REDUCTION INTERNAL FIXATION (ORIF) PROXIMAL HUMERUS FRACTURE (Right)  Subjective: Some pain in right shoulder overnight as block wore off, under better control now. Some right scapular/back pain.   Objective:  Filed Vitals:   11/17/14 2112  BP: 137/58  Pulse: 78  Temp: 98.1 F (36.7 C)  Resp: 16     R UE dressing c/d/i Wiggles fingers, distally NVI sling  Labs:   Recent Labs  11/17/14 1205 11/18/14 0408  HGB 10.7* 9.6*   Recent Labs  11/17/14 1205 11/18/14 0408  WBC 5.3 6.6  RBC 3.57* 3.27*  HCT 32.7* 30.2*  PLT 323 291   Recent Labs  11/17/14 1205 11/18/14 0408  NA 139 137  K 2.7* 2.8*  CL 102 103  CO2 26 26  BUN 19 12  CREATININE 1.22* 1.00  GLUCOSE 102* 112*  CALCIUM 9.2 8.0*    Assessment and Plan: 1 day s/p ORIF right proximal humerus fracture  ABLA- expected, low preoperatively, asymptomatic, will continue to monitor Potassium- low at 2.8, will consult medicine to help with medication changes/recommendations prior to d/c  Percocet for home pain control, script in chart Can d/c home today in sling once medications adjusted and pain under control  VTE proph: ASA  BID, SCDs

## 2014-11-18 NOTE — Consult Note (Signed)
[facility] Medical Consultation  Carollyn Etcheverry ZOX:[id number] DOB: 23-May-1945 DOA: 11/17/2014 PCP: Primus Bravo, NP   Requesting physician: Dr. Ave Filter  Date of consultation: 11/18/14 Reason for consultation: Hypokalmeia   Impression/Recommendations 1-Proximal humeral fracture: S/P ORIF -post operative care and further rec's per primary service   2-HTN: fairly well controlled, especially in setting of pain with recent RUE surgery -will substitute HCTZ for MaxZide to provide electrolytes sparing -recommended to follow heart healthy diet -will need follow up with PCP in approx 10 days for follow up of BP and electrolytes  3-hypokalemia: secondary to use of diuretics most likely. Also shifting with stress demargination and decrease PO intake contributing. -will give some Magnesium -Check Mg level -replete potassium with 40 Meq X 2 and increase daily supplementation to 40mg  at discharge -HCTZ changed to Maxzide as mentioned above -follow up of BMET in 10 days during follow up with PCP to reassess potassium trend  4-Depression: stable. Continue home medication regimen  5-HLD: continue statins  6-Diabetes type 2: appears to be well controlled and her CBG's in the 112-120 range -will continue metformin and glipizide -outpatient follow up of A1C and further adjustments to hypoglycemic regimen as needed  7-Hx of migraines: will continue topamax   Thank you for this consultation; I believe is safe to discharge patient home after magnesium and potassium repletion with outpatient follow up in approx 10 days with PCP. Please noticed recommendations substituting HCTZ and increasing potassium supplementation.   Chief Complaint: right shoulder pain  HPI: 69 y/o female with PMH significant for HTN, diabetes type 2, depression and HLD; admitted due to Right shoulder pain. Patient fell 2 weeks prior to admission and was found to have displaced right proximal humerus fracture. She is  status post ORIF and orthopedic service has consulted [facility] for assistance managing medical conditions and electrolytes repletion. Patient with hypokalemia.  Review of Systems:  Negative except as mentioned on HPI.  Past Medical History  Diagnosis Date  . Hypertension   . Diabetes mellitus without complication     type 2  . Sleep apnea     many years ago, does not use cpap  . Pneumonia   . History of hiatal hernia   . Fibromyalgia   . Arthritis   . IBS (irritable bowel syndrome)    Past Surgical History  Procedure Laterality Date  . Eye surgery Bilateral     cataract surgery with lens implant  . Tonsillectomy    . Abdominal hysterectomy    . Fracture surgery      tendon surgery left little finger  . Colonoscopy    . Orif humerus fracture Right 11/17/2014    Procedure: OPEN REDUCTION INTERNAL FIXATION (ORIF) PROXIMAL HUMERUS FRACTURE;  Surgeon: Jones Broom, MD;  Location: [facility] OR;  Service: Orthopedics;  Laterality: Right;  Open reduction internal fixation proximal humerus fracture right shoulder   Social History:  reports that she has never smoked. She has never used smokeless tobacco. She reports that she drinks alcohol. She reports that she does not use illicit drugs.  No Known Allergies   Family History  Problem Relation Age of Onset  . COPD Mother     Prior to Admission medications   Medication Sig Start Date End Date Taking? Authorizing Provider  alendronate (FOSAMAX) 70 MG tablet Take 1 tablet (70 mg total) by mouth every 7 (seven) days. 04/21/14  Yes Monica Becton, MD  allopurinol (ZYLOPRIM) 100 MG tablet Take 1 tablet (100 mg total) by mouth daily. 05/24/14  Yes Monica Becton, MD  cyclobenzaprine (FLEXERIL) 10 MG tablet Take 10 mg by mouth 3 (three) times daily as needed for muscle spasms. TAKE 1 TABLET THREE TIMES A DAY AS NEEDED FOR MUSCLE SPASM 04/07/14  Yes Historical Provider, MD  diazepam (VALIUM) 10 MG tablet Take 10 mg by mouth every 12 (twelve)  hours as needed for sleep. TAKE 1 TABLET 3 TIMES DAILY 03/15/14  Yes Historical Provider, MD  Fe Cbn-Fe Gluc-FA-B12-C-DSS (FERRALET 90) 90-1 MG TABS Take 1 tablet by mouth 2 (two) times daily. TAKE 1 TABLET TWICE A DAY AS DIRECTED 03/20/14  Yes Historical Provider, MD  glipiZIDE (GLUCOTROL XL) 2.5 MG 24 hr tablet Take 2.5 mg by mouth 2 (two) times daily.  03/15/14  Yes Historical Provider, MD  hydrochlorothiazide (HYDRODIURIL) 12.5 MG tablet Take 12.5 mg by mouth daily.  03/15/14 03/15/15 Yes Historical Provider, MD  HYDROcodone-acetaminophen (NORCO) 10-325 MG per tablet Take 1 tablet by mouth every 8 (eight) hours as needed. 11/16/14  Yes Monica Becton, MD  meloxicam (MOBIC) 15 MG tablet One tab PO qAM with breakfast Patient taking differently: Take 15 mg by mouth daily.  06/20/14  Yes Monica Becton, MD  metFORMIN (GLUCOPHAGE) 500 MG tablet Take 500 mg by mouth 2 (two) times daily with a meal.  03/15/14  Yes Historical Provider, MD  Misc. Devices (QUAD CANE) MISC 1 Device by Does not apply route. 02/10/12  Yes Historical Provider, MD  potassium chloride SA (KLOR-CON M20) 20 MEQ tablet Take 20 mEq by mouth daily.  02/18/14  Yes Historical Provider, MD  pravastatin (PRAVACHOL) 20 MG tablet Take 20 mg by mouth daily.  03/15/14  Yes Historical Provider, MD  topiramate (TOPAMAX) 100 MG tablet Take 100 mg by mouth 2 (two) times daily.  03/15/14  Yes Historical Provider, MD  venlafaxine XR (EFFEXOR-XR) 75 MG 24 hr capsule TAKE 1 CAPSULE (75 MG TOTAL) BY MOUTH DAILY WITH BREAKFAST. 10/14/14  Yes Monica Becton, MD  zolpidem (AMBIEN) 10 MG tablet TAKE 1 TABLET AT BEDTIME AS NEEDED FOR SLEEP 02/21/14  Yes Historical Provider, MD  docusate sodium (COLACE) 100 MG capsule Take 1 capsule (100 mg total) by mouth 3 (three) times daily as needed. 11/18/14   Jiles Harold, PA-C  oxyCODONE-acetaminophen (ROXICET) 5-325 MG per tablet Take 1-2 tablets by mouth every 4 (four) hours as needed for  severe pain. 11/18/14   Jiles Harold, PA-C   Physical Exam: Blood pressure 137/58, pulse 78, temperature 98.1 F (36.7 C), temperature source Oral, resp. rate 16, height 5\' 2"  (1.575 m), weight 80.74 kg (178 lb), SpO2 99 %. Filed Vitals:   11/17/14 2112  BP: 137/58  Pulse: 78  Temp: 98.1 F (36.7 C)  Resp: 16     General:  Afebrile, no CO or SOB. Reports pain on her right arm is well controlled  Eyes: PERRLA, no icterus, no nystagmus  ENT: no drainage out of ears or nostrils; no erythema or exudates inside her mouth.  Neck: supple, no JVD  Cardiovascular: RRR, no rubs or gallops   Respiratory: good air movement, no wheezing   Abdomen: soft, NT, ND, positive BS  Skin: no erythema or exudates appreciated; right upper extremity with clean and dry dressings   Musculoskeletal: no swelling in her LE, RUE with sling in place; no abnormalities on her LUE appreciated  Psychiatric: stable mood, no hallucinations  Neurologic: no focal deficit    Labs on Admission:  Basic Metabolic Panel:  Recent Labs Lab 11/17/14  1205 11/18/14 0408  NA 139 137  K 2.7* 2.8*  CL 102 103  CO2 26 26  GLUCOSE 102* 112*  BUN 19 12  CREATININE 1.22* 1.00  CALCIUM 9.2 8.0*   Liver Function Tests:  Recent Labs Lab 11/18/14 0408  AST 14*  ALT 6*  ALKPHOS 101  BILITOT 0.5  PROT 5.1*  ALBUMIN 2.7*   CBC:  Recent Labs Lab 11/17/14 1205 11/18/14 0408  WBC 5.3 6.6  HGB 10.7* 9.6*  HCT 32.7* 30.2*  MCV 91.6 92.4  PLT 323 291   CBG:  Recent Labs Lab 11/17/14 1135 11/17/14 1816 11/17/14 2110 11/18/14 0654  GLUCAP 110* 92 92 124*    Radiological Exams on Admission: Dg Shoulder Right  11/17/2014   CLINICAL DATA:  ORIF right shoulder.  EXAM: DG C-ARM 61-120 MIN; RIGHT SHOULDER - 2+ VIEW  COMPARISON:  08/ 24/2016.  Two images.  0 minutes 26 seconds.  FINDINGS: ORIF right humerus. Near anatomic alignment of right humeral fracture. Hardware intact.  IMPRESSION: ORIF right  humerus.   Electronically Signed   By: Maisie Fus  Register   On: 11/17/2014 15:54   Dg Shoulder Right  11/16/2014   CLINICAL DATA:  Fracture of the humerus.  EXAM: RIGHT SHOULDER - 2+ VIEW  COMPARISON:  None.  FINDINGS: Comminuted fracture of the proximal metaphysis of the right humerus is present. Displaced fracture fragments and angulation deformity noted. Multiple right rib fractures are noted. These appear to be old. No pneumothorax.  IMPRESSION: Comminuted displaced angulated fracture of the proximal right humerus is present.   Electronically Signed   By: Maisie Fus  Register   On: 11/16/2014 10:07   Dg C-arm 61-120 Min  11/17/2014   CLINICAL DATA:  ORIF right shoulder.  EXAM: DG C-ARM 61-120 MIN; RIGHT SHOULDER - 2+ VIEW  COMPARISON:  08/ 24/2016.  Two images.  0 minutes 26 seconds.  FINDINGS: ORIF right humerus. Near anatomic alignment of right humeral fracture. Hardware intact.  IMPRESSION: ORIF right humerus.   Electronically Signed   By: Maisie Fus  Register   On: 11/17/2014 15:54    EKG:  No acute ischemic changes, normal rate and sinus rhythm  Time spent: 45 minutes  Vassie Loll [facility] Pager [phone]  If 7PM-7AM, please contact night-coverage [email or web address] Password [facility] 11/18/2014, 9:39 AM

## 2014-11-18 NOTE — Anesthesia Postprocedure Evaluation (Signed)
  Anesthesia Post-op Note  Patient: Kristina Shea  Procedure(s) Performed: Procedure(s) with comments: OPEN REDUCTION INTERNAL FIXATION (ORIF) PROXIMAL HUMERUS FRACTURE (Right) - Open reduction internal fixation proximal humerus fracture right shoulder  Patient Location: PACU  Anesthesia Type:General  Level of Consciousness: awake  Airway and Oxygen Therapy: Patient Spontanous Breathing  Post-op Pain: mild  Post-op Assessment: Post-op Vital signs reviewed              Post-op Vital Signs: Reviewed  Last Vitals:  Filed Vitals:   11/17/14 2112  BP: 137/58  Pulse: 78  Temp: 36.7 C  Resp: 16    Complications: No apparent anesthesia complications

## 2014-11-21 NOTE — Discharge Summary (Signed)
Patient ID: Kristina Shea MRN: [id number] DOB/AGE: 1945/11/14 69 y.o.  Admit date: 11/17/2014 Discharge date: 11/21/2014  Admission Diagnoses:  Active Problems:   Proximal humeral fracture   Discharge Diagnoses:  Same  Past Medical History  Diagnosis Date  . Hypertension   . Diabetes mellitus without complication     type 2  . Sleep apnea     many years ago, does not use cpap  . Pneumonia   . History of hiatal hernia   . Fibromyalgia   . Arthritis   . IBS (irritable bowel syndrome)     Surgeries: Procedure(s): OPEN REDUCTION INTERNAL FIXATION (ORIF) PROXIMAL HUMERUS FRACTURE on 11/17/2014   Consultants: Treatment Team:  Magnus Ivan, MD  Discharged Condition: Improved  Hospital Course: Kristina Shea is an 69 y.o. female who was admitted 11/17/2014 for operative treatment of  displaced R proximal humerus fracture s/p Fall 2 wks ago.  After pre-op clearance the patient was taken to the operating room on 11/17/2014 and underwent  Procedure(s): OPEN REDUCTION INTERNAL FIXATION (ORIF) PROXIMAL HUMERUS FRACTURE.    Patient was given perioperative antibiotics:  Anti-infectives    Start     Dose/Rate Route Frequency Ordered Stop   11/17/14 2000  ceFAZolin (ANCEF) IVPB 1 g/50 mL premix     1 g 100 mL/hr over 30 Minutes Intravenous Every 6 hours 11/17/14 1822 11/18/14 1359   11/17/14 1300  ceFAZolin (ANCEF) IVPB 2 g/50 mL premix     2 g 100 mL/hr over 30 Minutes Intravenous To [facility] 11/16/14 1501 11/17/14 1353       Patient was given sequential compression devices, early ambulation, and ASA  BIDto prevent DVT.  Patient had pre-operative hypokalemia that remained stable postoperatively, medicine consulted to recommend medication changes. Pre-operative anemia also remained stable and asymptomatic. Patient benefited maximally from hospital stay and there were no complications.    Recent vital signs: No data found.    Recent laboratory  studies: No results for input(s): WBC, HGB, HCT, PLT, NA, K, CL, CO2, BUN, CREATININE, GLUCOSE, INR, CALCIUM in the last 72 hours.  Invalid input(s): PT, 2   Discharge Medications:     Medication List    STOP taking these medications        hydrochlorothiazide 12.5 MG tablet  Commonly known as:  HYDRODIURIL     HYDROcodone-acetaminophen 10-325 MG per tablet  Commonly known as:  NORCO      TAKE these medications        alendronate 70 MG tablet  Commonly known as:  FOSAMAX  Take 1 tablet (70 mg total) by mouth every 7 (seven) days.     allopurinol 100 MG tablet  Commonly known as:  ZYLOPRIM  Take 1 tablet (100 mg total) by mouth daily.     cyclobenzaprine 10 MG tablet  Commonly known as:  FLEXERIL  Take 10 mg by mouth 3 (three) times daily as needed for muscle spasms. TAKE 1 TABLET THREE TIMES A DAY AS NEEDED FOR MUSCLE SPASM     diazepam 10 MG tablet  Commonly known as:  VALIUM  Take 10 mg by mouth every 12 (twelve) hours as needed for sleep. TAKE 1 TABLET 3 TIMES DAILY     docusate sodium 100 MG capsule  Commonly known as:  COLACE  Take 1 capsule (100 mg total) by mouth 3 (three) times daily as needed.     FERRALET 90 90-1 MG Tabs  Take 1 tablet by mouth 2 (two) times daily. TAKE 1 TABLET  TWICE A DAY AS DIRECTED     glipiZIDE 2.5 MG 24 hr tablet  Commonly known as:  GLUCOTROL XL  Take 2.5 mg by mouth 2 (two) times daily.     meloxicam 15 MG tablet  Commonly known as:  MOBIC  One tab PO qAM with breakfast     metFORMIN 500 MG tablet  Commonly known as:  GLUCOPHAGE  Take 500 mg by mouth 2 (two) times daily with a meal.     oxyCODONE-acetaminophen 5-325 MG per tablet  Commonly known as:  ROXICET  Take 1-2 tablets by mouth every 4 (four) hours as needed for severe pain.     potassium chloride SA 20 MEQ tablet  Commonly known as:  KLOR-CON M20  Take 2 tablets (40 mEq total) by mouth daily.     pravastatin 20 MG tablet  Commonly known as:  PRAVACHOL  Take 20  mg by mouth daily.     [employer] Misc  1 Device by Does not apply route.     topiramate 100 MG tablet  Commonly known as:  TOPAMAX  Take 100 mg by mouth 2 (two) times daily.     triamterene-hydrochlorothiazide 37.5-25 MG per tablet  Commonly known as:  MAXZIDE-25  Take 0.5 tablets by mouth daily.     venlafaxine XR 75 MG 24 hr capsule  Commonly known as:  EFFEXOR-XR  TAKE 1 CAPSULE (75 MG TOTAL) BY MOUTH DAILY WITH BREAKFAST.     zolpidem 10 MG tablet  Commonly known as:  AMBIEN  TAKE 1 TABLET AT BEDTIME AS NEEDED FOR SLEEP        Diagnostic Studies: Dg Shoulder Right  11/17/2014   CLINICAL DATA:  ORIF right shoulder.  EXAM: DG C-ARM 61-120 MIN; RIGHT SHOULDER - 2+ VIEW  COMPARISON:  08/ 24/2016.  Two images.  0 minutes 26 seconds.  FINDINGS: ORIF right humerus. Near anatomic alignment of right humeral fracture. Hardware intact.  IMPRESSION: ORIF right humerus.   Electronically Signed   By: Maisie Fus  Register   On: 11/17/2014 15:54   Dg Shoulder Right  11/16/2014   CLINICAL DATA:  Fracture of the humerus.  EXAM: RIGHT SHOULDER - 2+ VIEW  COMPARISON:  None.  FINDINGS: Comminuted fracture of the proximal metaphysis of the right humerus is present. Displaced fracture fragments and angulation deformity noted. Multiple right rib fractures are noted. These appear to be old. No pneumothorax.  IMPRESSION: Comminuted displaced angulated fracture of the proximal right humerus is present.   Electronically Signed   By: Maisie Fus  Register   On: 11/16/2014 10:07   Dg C-arm 61-120 Min  11/17/2014   CLINICAL DATA:  ORIF right shoulder.  EXAM: DG C-ARM 61-120 MIN; RIGHT SHOULDER - 2+ VIEW  COMPARISON:  08/ 24/2016.  Two images.  0 minutes 26 seconds.  FINDINGS: ORIF right humerus. Near anatomic alignment of right humeral fracture. Hardware intact.  IMPRESSION: ORIF right humerus.   Electronically Signed   By: Maisie Fus  Register   On: 11/17/2014 15:54    Disposition: 01-Home or Self Care      Discharge  Instructions    Call MD / Call 911    Complete by:  As directed   If you experience chest pain or shortness of breath, CALL 911 and be transported to the hospital emergency room.  If you develope a fever above 101 F, pus (white drainage) or increased drainage or redness at the wound, or calf pain, call your surgeon's office.  Constipation Prevention    Complete by:  As directed   Drink plenty of fluids.  Prune juice may be helpful.  You may use a stool softener, such as Colace (over the counter) 100 mg twice a day.  Use MiraLax (over the counter) for constipation as needed.     Diet - low sodium heart healthy    Complete by:  As directed      Increase activity slowly as tolerated    Complete by:  As directed            Follow-up Information    Follow up with Mable Paris, MD.   Specialty:  Orthopedic Surgery   Contact information:   [address] [place] [address] [phone]        Signed: Jiles Harold 11/21/2014, 1:33 PM

## 2014-11-24 NOTE — Addendum Note (Signed)
Addendum  created 11/24/14 1806 by Kipp Brood, MD   Modules edited: Anesthesia Blocks and Procedures, Clinical Notes   Clinical Notes:  File: [id number]

## 2014-12-02 DIAGNOSIS — S42291D Other displaced fracture of upper end of right humerus, subsequent encounter for fracture with routine healing: Secondary | ICD-10-CM | POA: Diagnosis not present

## 2014-12-05 DIAGNOSIS — M62830 Muscle spasm of back: Secondary | ICD-10-CM | POA: Diagnosis not present

## 2014-12-05 DIAGNOSIS — E119 Type 2 diabetes mellitus without complications: Secondary | ICD-10-CM | POA: Diagnosis not present

## 2014-12-05 DIAGNOSIS — M797 Fibromyalgia: Secondary | ICD-10-CM | POA: Diagnosis not present

## 2014-12-05 DIAGNOSIS — S42201A Unspecified fracture of upper end of right humerus, initial encounter for closed fracture: Secondary | ICD-10-CM | POA: Diagnosis not present

## 2014-12-05 DIAGNOSIS — I10 Essential (primary) hypertension: Secondary | ICD-10-CM | POA: Diagnosis not present

## 2014-12-07 ENCOUNTER — Ambulatory Visit: Payer: Medicare Other | Admitting: Sports Medicine

## 2014-12-07 ENCOUNTER — Encounter: Payer: Self-pay | Admitting: Sports Medicine

## 2014-12-07 VITALS — BP 130/61 | HR 86 | Ht 62.0 in | Wt 168.0 lb

## 2014-12-07 DIAGNOSIS — Z9889 Other specified postprocedural states: Secondary | ICD-10-CM

## 2014-12-07 DIAGNOSIS — Z8781 Personal history of (healed) traumatic fracture: Principal | ICD-10-CM

## 2014-12-07 DIAGNOSIS — M5412 Radiculopathy, cervical region: Secondary | ICD-10-CM

## 2014-12-07 MED ORDER — OXYCODONE-ACETAMINOPHEN 5-325 MG PO TABS: 1.0000 | ORAL_TABLET | ORAL | Status: DC | PRN

## 2014-12-07 MED ORDER — MELOXICAM 15 MG PO TABS: ORAL_TABLET | ORAL | Status: DC

## 2014-12-07 NOTE — Progress Notes (Signed)
  Subjective: Follow-up right proximal humeral fracture, she is about 3 weeks postop, overall doing okay. It sounds as though one of the screws is approaching the articular surface secondary to the osteoporosis unfortunately, Dr. Ave Filter is going to keep an eye on this.  Objective: General: Well-developed, well-nourished, and in no acute distress. Right shoulder: Surgical scar is well-healed. Currently in a sling.  Assessment/plan:

## 2014-12-07 NOTE — Assessment & Plan Note (Signed)
Doing well 3 weeks post a right proximal humeral ORIF. One of the screws is approaching the articular surface unfortunately so Dr. Ave Filter will be keeping an eye on this. Refilling some pain medication, I am unable to do her facet injections until her fracture is healed.

## 2014-12-12 ENCOUNTER — Other Ambulatory Visit: Payer: Self-pay | Admitting: Sports Medicine

## 2014-12-12 DIAGNOSIS — S42291D Other displaced fracture of upper end of right humerus, subsequent encounter for fracture with routine healing: Secondary | ICD-10-CM | POA: Diagnosis not present

## 2015-01-04 DIAGNOSIS — S42291D Other displaced fracture of upper end of right humerus, subsequent encounter for fracture with routine healing: Secondary | ICD-10-CM | POA: Diagnosis not present

## 2015-01-06 DIAGNOSIS — M25511 Pain in right shoulder: Secondary | ICD-10-CM | POA: Diagnosis not present

## 2015-01-06 DIAGNOSIS — M25611 Stiffness of right shoulder, not elsewhere classified: Secondary | ICD-10-CM | POA: Diagnosis not present

## 2015-01-06 DIAGNOSIS — M6281 Muscle weakness (generalized): Secondary | ICD-10-CM | POA: Diagnosis not present

## 2015-01-10 DIAGNOSIS — M25511 Pain in right shoulder: Secondary | ICD-10-CM | POA: Diagnosis not present

## 2015-01-10 DIAGNOSIS — M6281 Muscle weakness (generalized): Secondary | ICD-10-CM | POA: Diagnosis not present

## 2015-01-10 DIAGNOSIS — M25611 Stiffness of right shoulder, not elsewhere classified: Secondary | ICD-10-CM | POA: Diagnosis not present

## 2015-01-16 DIAGNOSIS — M25611 Stiffness of right shoulder, not elsewhere classified: Secondary | ICD-10-CM | POA: Diagnosis not present

## 2015-01-16 DIAGNOSIS — M25511 Pain in right shoulder: Secondary | ICD-10-CM | POA: Diagnosis not present

## 2015-01-16 DIAGNOSIS — M6281 Muscle weakness (generalized): Secondary | ICD-10-CM | POA: Diagnosis not present

## 2015-01-18 DIAGNOSIS — M6281 Muscle weakness (generalized): Secondary | ICD-10-CM | POA: Diagnosis not present

## 2015-01-18 DIAGNOSIS — M25511 Pain in right shoulder: Secondary | ICD-10-CM | POA: Diagnosis not present

## 2015-01-18 DIAGNOSIS — M25611 Stiffness of right shoulder, not elsewhere classified: Secondary | ICD-10-CM | POA: Diagnosis not present

## 2015-01-23 DIAGNOSIS — M6281 Muscle weakness (generalized): Secondary | ICD-10-CM | POA: Diagnosis not present

## 2015-01-23 DIAGNOSIS — M25511 Pain in right shoulder: Secondary | ICD-10-CM | POA: Diagnosis not present

## 2015-01-23 DIAGNOSIS — M25611 Stiffness of right shoulder, not elsewhere classified: Secondary | ICD-10-CM | POA: Diagnosis not present

## 2015-01-25 DIAGNOSIS — M25611 Stiffness of right shoulder, not elsewhere classified: Secondary | ICD-10-CM | POA: Diagnosis not present

## 2015-01-25 DIAGNOSIS — M6281 Muscle weakness (generalized): Secondary | ICD-10-CM | POA: Diagnosis not present

## 2015-01-25 DIAGNOSIS — M25511 Pain in right shoulder: Secondary | ICD-10-CM | POA: Diagnosis not present

## 2015-02-01 DIAGNOSIS — M25611 Stiffness of right shoulder, not elsewhere classified: Secondary | ICD-10-CM | POA: Diagnosis not present

## 2015-02-01 DIAGNOSIS — M6281 Muscle weakness (generalized): Secondary | ICD-10-CM | POA: Diagnosis not present

## 2015-02-01 DIAGNOSIS — M25511 Pain in right shoulder: Secondary | ICD-10-CM | POA: Diagnosis not present

## 2015-02-02 DIAGNOSIS — M25611 Stiffness of right shoulder, not elsewhere classified: Secondary | ICD-10-CM | POA: Diagnosis not present

## 2015-02-02 DIAGNOSIS — M6281 Muscle weakness (generalized): Secondary | ICD-10-CM | POA: Diagnosis not present

## 2015-02-02 DIAGNOSIS — M25511 Pain in right shoulder: Secondary | ICD-10-CM | POA: Diagnosis not present

## 2015-02-05 IMAGING — DX DG DXA BONE DENSITY STUDY HL7
4 series · 4 of 4 positions shown · non-contrast
Comparison: None.

CLINICAL DATA: Postmenopausal osteoporosis screening.

EXAM:
DUAL X-RAY ABSORPTIOMETRY (DXA) FOR BONE MINERAL DENSITY

[view not recorded (1 of 4)]
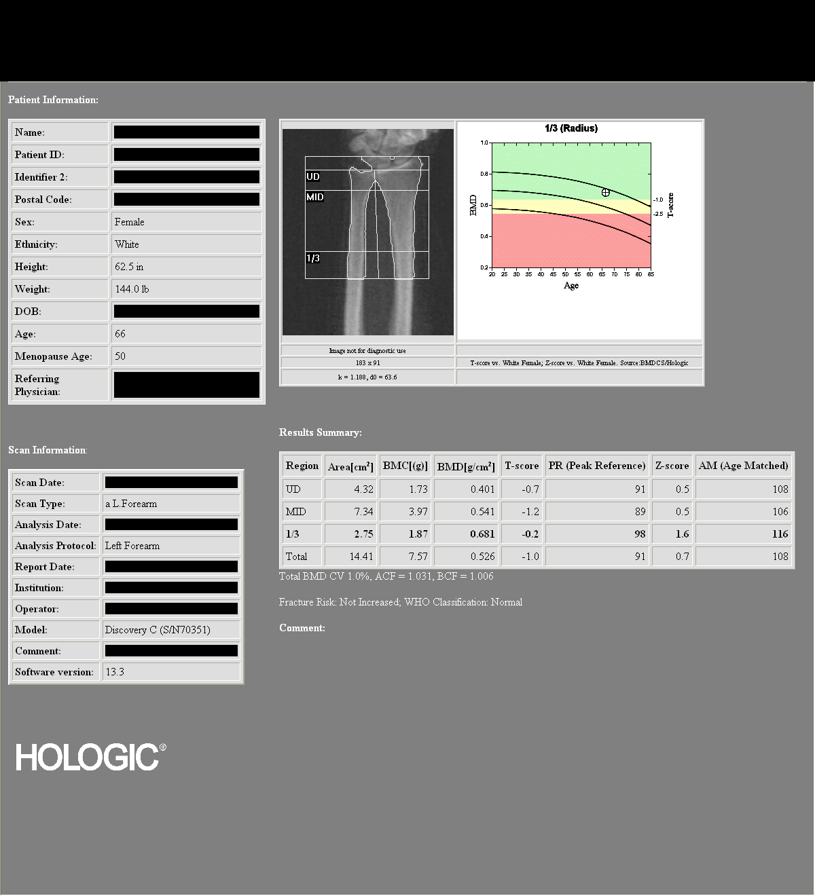

[view not recorded (2 of 4)]
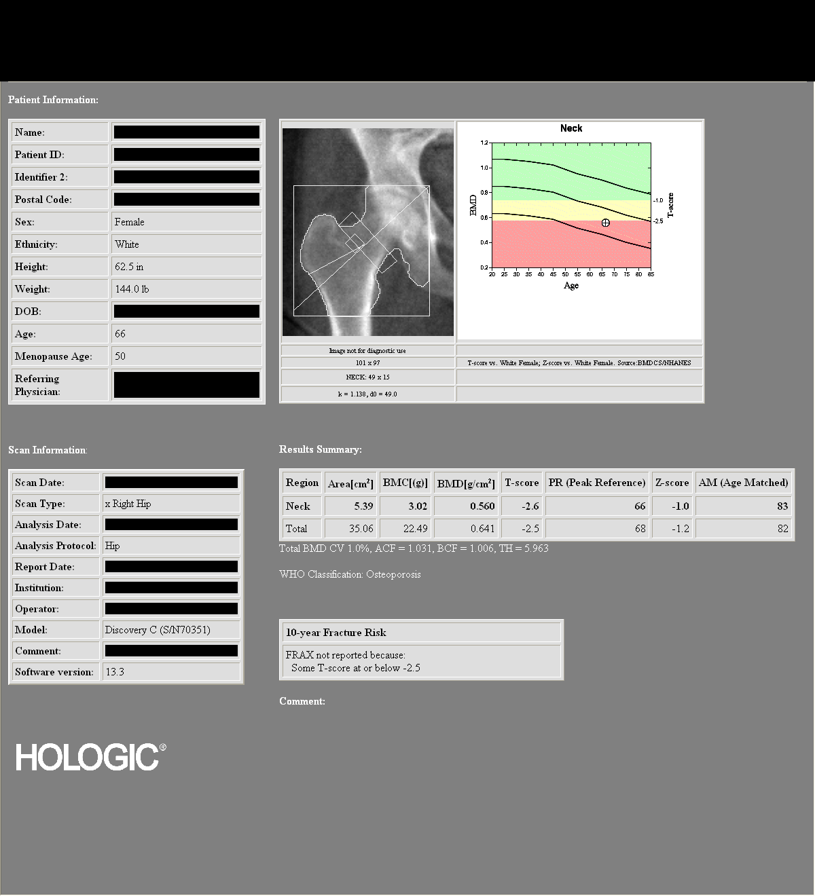

[view not recorded (3 of 4)]
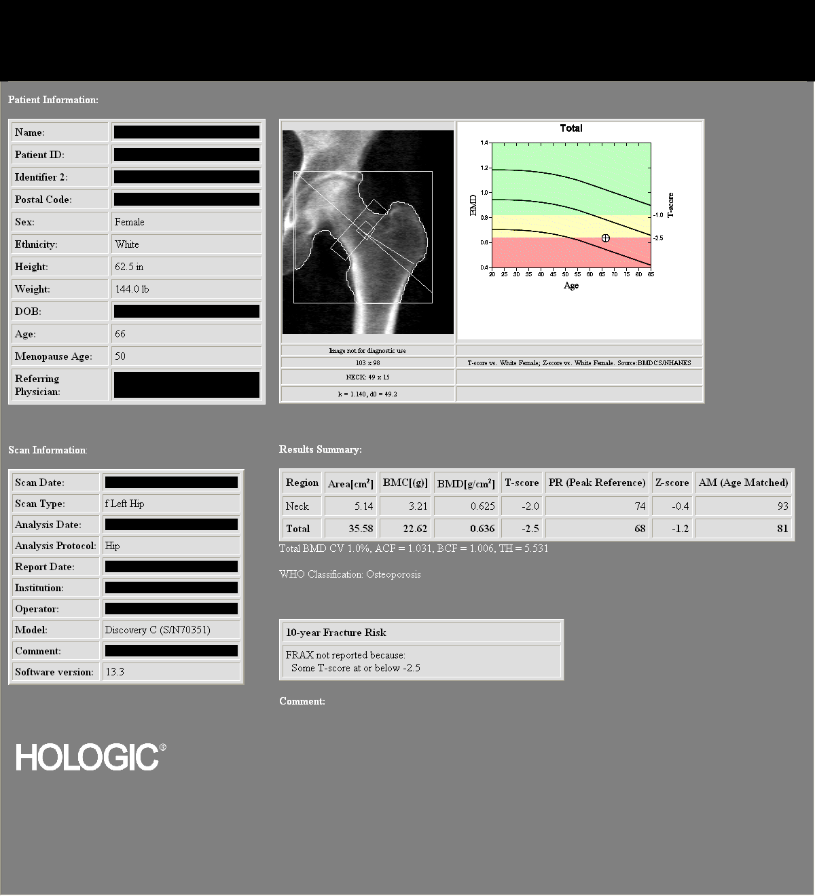

[view not recorded (4 of 4)]
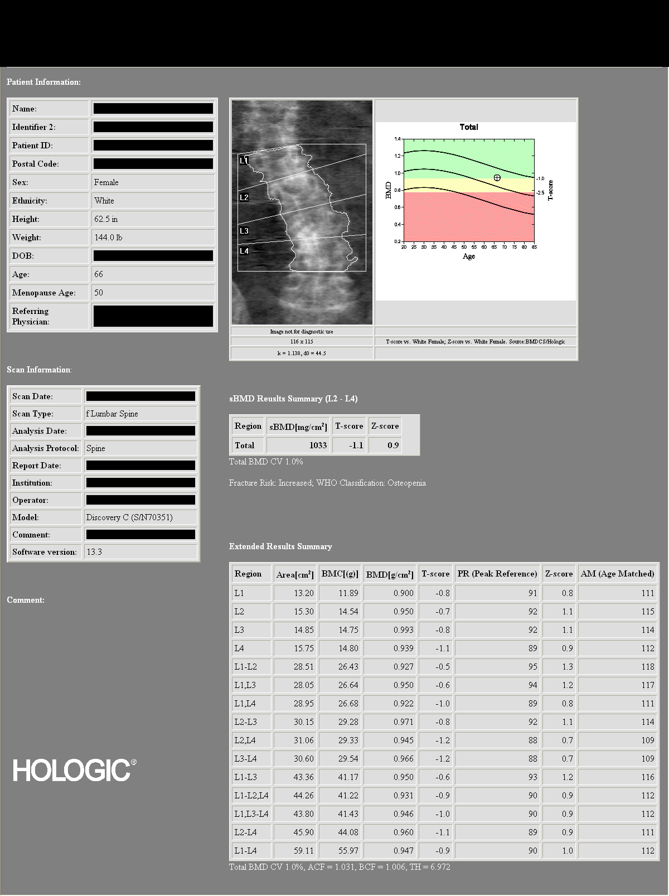

[4 of 4 positions shown; findings below may reference images not displayed]

FINDINGS: AP LUMBAR SPINE L1-L4

Bone Mineral Density (BMD):  0.947 g/cm2

Young Adult T-Score:  -0.9

Z-Score:

Right FEMUR neck

Bone Mineral Density (BMD):  0.560 g/cm2

Young Adult T-Score: -2.6

Z-Score:  -1.0

Left FOREARM ([DATE] RADIUS)

Bone Mineral Density (BMD):

Young Adult T Score:  -0.2

Z Score:

ASSESSMENT: Patient's diagnostic category is OSTEOPOROSIS by WHO
Criteria.

FRACTURE RISK: INCREASED.

FRAX:  Not calculated due to T-score at or below -2.5
Effective therapies are available in the form of bisphosphonates,
selective estrogen receptor modulators, biologic agents, and hormone
replacement therapy (for women). All patients should ensure an
adequate intake of dietary calcium (8099 mg daily) and vitamin D
(800 Shu-Ping Manyun) unless contraindicated.

All treatment decisions require clinical judgment and consideration
of individual patient factors, including patient preferences,
co-morbidities, previous drug use, risk factors not captured in the
FRAX model (e.g., frailty, falls, vitamin D deficiency, increased
bone turnover, interval significant decline in bone density) and
possible under- or over-estimation of fracture risk by FRAX.

The National Osteoporosis Foundation recommends that FDA-approved
medical therapies be considered in postmenopausal women and men age
50 or older with a:

1. Hip or vertebral (clinical or morphometric) fracture.

2. T-score of -2.5 or lower at the spine or hip.

3. Ten-year fracture probability by FRAX of 3% or greater for hip
fracture or 20% or greater for major osteoporotic fracture.

People with diagnosed cases of osteoporosis or at high risk for
fracture should have regular bone mineral density tests. For
patients eligible for Medicare, routine testing is allowed once
every 2 years. The testing frequency can be increased to one year
for patients who have rapidly progressing disease, those who are
receiving or discontinuing medical therapy to restore bone mass, or
have additional risk factors.

World Health Organization (WHO) Criteria:

Normal: T-scores from +1.0 to -1.0

Low Bone Mass (Osteopenia): T-scores between -1.0 and -2.5

Osteoporosis: T-scores -2.5 and below

Comparison to Reference Population:

T-score is the key measure used in the diagnosis of osteoporosis and
relative risk determination for fracture. It provides a value for
bone mass relative to the mean bone mass of a young adult reference
population expressed in terms of standard deviation (SD).

Z-score is the age-matched score showing the patient's values
compared to a population matched for age, sex, and race. This is
also expressed in terms of standard deviation. The patient may have
values that compare favorably to the age-matched values and still be
at increased risk for fracture.

## 2015-02-06 DIAGNOSIS — M6281 Muscle weakness (generalized): Secondary | ICD-10-CM | POA: Diagnosis not present

## 2015-02-06 DIAGNOSIS — M25511 Pain in right shoulder: Secondary | ICD-10-CM | POA: Diagnosis not present

## 2015-02-06 DIAGNOSIS — M25611 Stiffness of right shoulder, not elsewhere classified: Secondary | ICD-10-CM | POA: Diagnosis not present

## 2015-02-08 DIAGNOSIS — M6281 Muscle weakness (generalized): Secondary | ICD-10-CM | POA: Diagnosis not present

## 2015-02-08 DIAGNOSIS — M25511 Pain in right shoulder: Secondary | ICD-10-CM | POA: Diagnosis not present

## 2015-02-08 DIAGNOSIS — M25611 Stiffness of right shoulder, not elsewhere classified: Secondary | ICD-10-CM | POA: Diagnosis not present

## 2015-02-13 DIAGNOSIS — S42291D Other displaced fracture of upper end of right humerus, subsequent encounter for fracture with routine healing: Secondary | ICD-10-CM | POA: Diagnosis not present

## 2015-02-14 DIAGNOSIS — M25611 Stiffness of right shoulder, not elsewhere classified: Secondary | ICD-10-CM | POA: Diagnosis not present

## 2015-02-14 DIAGNOSIS — M25511 Pain in right shoulder: Secondary | ICD-10-CM | POA: Diagnosis not present

## 2015-02-14 DIAGNOSIS — M6281 Muscle weakness (generalized): Secondary | ICD-10-CM | POA: Diagnosis not present

## 2015-02-16 ENCOUNTER — Other Ambulatory Visit: Payer: Self-pay | Admitting: Sports Medicine

## 2015-02-20 ENCOUNTER — Other Ambulatory Visit: Payer: Self-pay | Admitting: Sports Medicine

## 2015-02-20 DIAGNOSIS — M6281 Muscle weakness (generalized): Secondary | ICD-10-CM | POA: Diagnosis not present

## 2015-02-20 DIAGNOSIS — M25511 Pain in right shoulder: Secondary | ICD-10-CM | POA: Diagnosis not present

## 2015-02-20 DIAGNOSIS — M25611 Stiffness of right shoulder, not elsewhere classified: Secondary | ICD-10-CM | POA: Diagnosis not present

## 2015-02-20 DIAGNOSIS — M1000 Idiopathic gout, unspecified site: Secondary | ICD-10-CM

## 2015-02-20 MED ORDER — VENLAFAXINE HCL ER 75 MG PO CP24: ORAL_CAPSULE | ORAL | Status: DC

## 2015-02-20 MED ORDER — ALLOPURINOL 100 MG PO TABS: 100.0000 mg | ORAL_TABLET | Freq: Every day | ORAL | Status: DC

## 2015-02-23 DIAGNOSIS — M25611 Stiffness of right shoulder, not elsewhere classified: Secondary | ICD-10-CM | POA: Diagnosis not present

## 2015-02-23 DIAGNOSIS — M25511 Pain in right shoulder: Secondary | ICD-10-CM | POA: Diagnosis not present

## 2015-02-23 DIAGNOSIS — M6281 Muscle weakness (generalized): Secondary | ICD-10-CM | POA: Diagnosis not present

## 2015-02-27 DIAGNOSIS — M25611 Stiffness of right shoulder, not elsewhere classified: Secondary | ICD-10-CM | POA: Diagnosis not present

## 2015-02-27 DIAGNOSIS — M6281 Muscle weakness (generalized): Secondary | ICD-10-CM | POA: Diagnosis not present

## 2015-02-27 DIAGNOSIS — M25511 Pain in right shoulder: Secondary | ICD-10-CM | POA: Diagnosis not present

## 2015-02-28 ENCOUNTER — Telehealth: Payer: Self-pay

## 2015-03-01 DIAGNOSIS — M6281 Muscle weakness (generalized): Secondary | ICD-10-CM | POA: Diagnosis not present

## 2015-03-01 DIAGNOSIS — M25511 Pain in right shoulder: Secondary | ICD-10-CM | POA: Diagnosis not present

## 2015-03-01 DIAGNOSIS — M25611 Stiffness of right shoulder, not elsewhere classified: Secondary | ICD-10-CM | POA: Diagnosis not present

## 2015-03-03 NOTE — Telephone Encounter (Signed)
Error

## 2015-03-08 DIAGNOSIS — M6281 Muscle weakness (generalized): Secondary | ICD-10-CM | POA: Diagnosis not present

## 2015-03-08 DIAGNOSIS — M25611 Stiffness of right shoulder, not elsewhere classified: Secondary | ICD-10-CM | POA: Diagnosis not present

## 2015-03-08 DIAGNOSIS — M25511 Pain in right shoulder: Secondary | ICD-10-CM | POA: Diagnosis not present

## 2015-03-09 DIAGNOSIS — F419 Anxiety disorder, unspecified: Secondary | ICD-10-CM | POA: Diagnosis not present

## 2015-03-09 DIAGNOSIS — E119 Type 2 diabetes mellitus without complications: Secondary | ICD-10-CM | POA: Diagnosis not present

## 2015-03-09 DIAGNOSIS — M549 Dorsalgia, unspecified: Secondary | ICD-10-CM | POA: Diagnosis not present

## 2015-03-10 DIAGNOSIS — M25511 Pain in right shoulder: Secondary | ICD-10-CM | POA: Diagnosis not present

## 2015-03-14 DIAGNOSIS — M6281 Muscle weakness (generalized): Secondary | ICD-10-CM | POA: Diagnosis not present

## 2015-03-14 DIAGNOSIS — M25611 Stiffness of right shoulder, not elsewhere classified: Secondary | ICD-10-CM | POA: Diagnosis not present

## 2015-03-14 DIAGNOSIS — M25511 Pain in right shoulder: Secondary | ICD-10-CM | POA: Diagnosis not present

## 2015-03-16 DIAGNOSIS — M25611 Stiffness of right shoulder, not elsewhere classified: Secondary | ICD-10-CM | POA: Diagnosis not present

## 2015-03-16 DIAGNOSIS — M6281 Muscle weakness (generalized): Secondary | ICD-10-CM | POA: Diagnosis not present

## 2015-03-16 DIAGNOSIS — M25511 Pain in right shoulder: Secondary | ICD-10-CM | POA: Diagnosis not present

## 2015-03-21 DIAGNOSIS — M25511 Pain in right shoulder: Secondary | ICD-10-CM | POA: Diagnosis not present

## 2015-03-21 DIAGNOSIS — M6281 Muscle weakness (generalized): Secondary | ICD-10-CM | POA: Diagnosis not present

## 2015-03-21 DIAGNOSIS — M25611 Stiffness of right shoulder, not elsewhere classified: Secondary | ICD-10-CM | POA: Diagnosis not present

## 2015-03-23 DIAGNOSIS — M25511 Pain in right shoulder: Secondary | ICD-10-CM | POA: Diagnosis not present

## 2015-03-23 DIAGNOSIS — M25611 Stiffness of right shoulder, not elsewhere classified: Secondary | ICD-10-CM | POA: Diagnosis not present

## 2015-03-23 DIAGNOSIS — M6281 Muscle weakness (generalized): Secondary | ICD-10-CM | POA: Diagnosis not present

## 2015-03-30 DIAGNOSIS — M25511 Pain in right shoulder: Secondary | ICD-10-CM | POA: Diagnosis not present

## 2015-03-30 DIAGNOSIS — M25611 Stiffness of right shoulder, not elsewhere classified: Secondary | ICD-10-CM | POA: Diagnosis not present

## 2015-03-30 DIAGNOSIS — M6281 Muscle weakness (generalized): Secondary | ICD-10-CM | POA: Diagnosis not present

## 2015-04-06 DIAGNOSIS — M25511 Pain in right shoulder: Secondary | ICD-10-CM | POA: Diagnosis not present

## 2015-04-06 DIAGNOSIS — M25611 Stiffness of right shoulder, not elsewhere classified: Secondary | ICD-10-CM | POA: Diagnosis not present

## 2015-04-06 DIAGNOSIS — M6281 Muscle weakness (generalized): Secondary | ICD-10-CM | POA: Diagnosis not present

## 2015-04-10 DIAGNOSIS — M6281 Muscle weakness (generalized): Secondary | ICD-10-CM | POA: Diagnosis not present

## 2015-04-10 DIAGNOSIS — M25611 Stiffness of right shoulder, not elsewhere classified: Secondary | ICD-10-CM | POA: Diagnosis not present

## 2015-04-10 DIAGNOSIS — M25511 Pain in right shoulder: Secondary | ICD-10-CM | POA: Diagnosis not present

## 2015-04-13 DIAGNOSIS — M6281 Muscle weakness (generalized): Secondary | ICD-10-CM | POA: Diagnosis not present

## 2015-04-13 DIAGNOSIS — M25611 Stiffness of right shoulder, not elsewhere classified: Secondary | ICD-10-CM | POA: Diagnosis not present

## 2015-04-13 DIAGNOSIS — M25511 Pain in right shoulder: Secondary | ICD-10-CM | POA: Diagnosis not present

## 2015-04-18 DIAGNOSIS — M25511 Pain in right shoulder: Secondary | ICD-10-CM | POA: Diagnosis not present

## 2015-04-18 DIAGNOSIS — M6281 Muscle weakness (generalized): Secondary | ICD-10-CM | POA: Diagnosis not present

## 2015-04-18 DIAGNOSIS — M25611 Stiffness of right shoulder, not elsewhere classified: Secondary | ICD-10-CM | POA: Diagnosis not present

## 2015-04-20 ENCOUNTER — Ambulatory Visit (INDEPENDENT_AMBULATORY_CARE_PROVIDER_SITE_OTHER): Payer: Medicare Other | Admitting: Sports Medicine

## 2015-04-20 DIAGNOSIS — M47816 Spondylosis without myelopathy or radiculopathy, lumbar region: Secondary | ICD-10-CM

## 2015-04-20 DIAGNOSIS — M797 Fibromyalgia: Secondary | ICD-10-CM

## 2015-04-20 MED ORDER — GABAPENTIN 100 MG PO CAPS: 100.0000 mg | ORAL_CAPSULE | Freq: Two times a day (BID) | ORAL | Status: DC

## 2015-04-20 NOTE — Progress Notes (Signed)
  Subjective:    CC:  Back pain  HPI: This is a pleasant 70 year old female, we treated her with left-sided L3-S1 facet injections, she had concordant pain at the L3-L4 facet , and had 10 months of relief. She is requesting repeat injection.  Status post humeral ORIF: Doing well, still in physical therapy. She had a rotator cuff repair concurrently.  Fibromyalgia: increasing widespread pain, not on any fibromyalgia specific medications right now.  Past medical history, Surgical history, Family history not pertinant except as noted below, Social history, Allergies, and medications have been entered into the medical record, reviewed, and no changes needed.   Review of Systems: No fevers, chills, night sweats, weight loss, chest pain, or shortness of breath.   Objective:    General: Well Developed, well nourished, and in no acute distress.  Neuro: Alert and oriented x3, extra-ocular muscles intact, sensation grossly intact.  HEENT: Normocephalic, atraumatic, pupils equal round reactive to light, neck supple, no masses, no lymphadenopathy, thyroid nonpalpable.  Skin: Warm and dry, no rashes. Cardiac: Regular rate and rhythm, no murmurs rubs or gallops, no lower extremity edema.  Respiratory: Clear to auscultation bilaterally. Not using accessory muscles, speaking in full sentences.  Impression and Recommendations:

## 2015-04-20 NOTE — Assessment & Plan Note (Signed)
Having some left-sided C6 distribtion paresthesias, this may be some of her  fibromyalgia Adding 100 mg of gabapentin.

## 2015-04-20 NOTE — Assessment & Plan Note (Addendum)
Ten-month response to prior left-sided L3-L4 facet injection, she had the most concordant pain at the L3-L4 facet, however she did have injections at the L4-5 in the L5-S1 facets. A prior epidural provided no response. At this point we are going to repeat a left-sided L3-L4 facet injection by Dr. Alfredo Batty per patient request.   adding physical therapy per patient request and return to see me in one month.

## 2015-04-21 DIAGNOSIS — M6281 Muscle weakness (generalized): Secondary | ICD-10-CM | POA: Diagnosis not present

## 2015-04-21 DIAGNOSIS — M25511 Pain in right shoulder: Secondary | ICD-10-CM | POA: Diagnosis not present

## 2015-04-21 DIAGNOSIS — M25611 Stiffness of right shoulder, not elsewhere classified: Secondary | ICD-10-CM | POA: Diagnosis not present

## 2015-04-21 DIAGNOSIS — M546 Pain in thoracic spine: Secondary | ICD-10-CM | POA: Diagnosis not present

## 2015-04-25 DIAGNOSIS — M25611 Stiffness of right shoulder, not elsewhere classified: Secondary | ICD-10-CM | POA: Diagnosis not present

## 2015-04-25 DIAGNOSIS — M25511 Pain in right shoulder: Secondary | ICD-10-CM | POA: Diagnosis not present

## 2015-04-25 DIAGNOSIS — M6281 Muscle weakness (generalized): Secondary | ICD-10-CM | POA: Diagnosis not present

## 2015-04-25 DIAGNOSIS — M546 Pain in thoracic spine: Secondary | ICD-10-CM | POA: Diagnosis not present

## 2015-04-27 ENCOUNTER — Ambulatory Visit
Admission: RE | Admit: 2015-04-27 | Discharge: 2015-04-27 | Disposition: A | Discharge status: Home / Self Care | Payer: Medicare Other | Source: Ambulatory Visit | Attending: Sports Medicine | Admitting: Sports Medicine

## 2015-04-27 DIAGNOSIS — M545 Low back pain: Secondary | ICD-10-CM | POA: Diagnosis not present

## 2015-04-27 MED ORDER — IOHEXOL 180 MG/ML  SOLN
1.0000 mL | Freq: Once | INTRAMUSCULAR | Status: AC | PRN
  Administered 2015-04-27: 1 mL via INTRA_ARTICULAR

## 2015-04-27 MED ORDER — METHYLPREDNISOLONE ACETATE 40 MG/ML INJ SUSP (RADIOLOG
120.0000 mg | Freq: Once | INTRAMUSCULAR | Status: AC
  Administered 2015-04-27: 120 mg via INTRA_ARTICULAR

## 2015-04-27 NOTE — Discharge Instructions (Signed)

## 2015-05-02 DIAGNOSIS — M546 Pain in thoracic spine: Secondary | ICD-10-CM | POA: Diagnosis not present

## 2015-05-02 DIAGNOSIS — M25511 Pain in right shoulder: Secondary | ICD-10-CM | POA: Diagnosis not present

## 2015-05-02 DIAGNOSIS — M25611 Stiffness of right shoulder, not elsewhere classified: Secondary | ICD-10-CM | POA: Diagnosis not present

## 2015-05-02 DIAGNOSIS — M6281 Muscle weakness (generalized): Secondary | ICD-10-CM | POA: Diagnosis not present

## 2015-05-04 DIAGNOSIS — M6281 Muscle weakness (generalized): Secondary | ICD-10-CM | POA: Diagnosis not present

## 2015-05-04 DIAGNOSIS — M546 Pain in thoracic spine: Secondary | ICD-10-CM | POA: Diagnosis not present

## 2015-05-04 DIAGNOSIS — M25511 Pain in right shoulder: Secondary | ICD-10-CM | POA: Diagnosis not present

## 2015-05-04 DIAGNOSIS — M25611 Stiffness of right shoulder, not elsewhere classified: Secondary | ICD-10-CM | POA: Diagnosis not present

## 2015-05-09 DIAGNOSIS — M6281 Muscle weakness (generalized): Secondary | ICD-10-CM | POA: Diagnosis not present

## 2015-05-09 DIAGNOSIS — M25611 Stiffness of right shoulder, not elsewhere classified: Secondary | ICD-10-CM | POA: Diagnosis not present

## 2015-05-09 DIAGNOSIS — M25511 Pain in right shoulder: Secondary | ICD-10-CM | POA: Diagnosis not present

## 2015-05-09 DIAGNOSIS — M546 Pain in thoracic spine: Secondary | ICD-10-CM | POA: Diagnosis not present

## 2015-05-11 DIAGNOSIS — M25511 Pain in right shoulder: Secondary | ICD-10-CM | POA: Diagnosis not present

## 2015-05-11 DIAGNOSIS — M6281 Muscle weakness (generalized): Secondary | ICD-10-CM | POA: Diagnosis not present

## 2015-05-11 DIAGNOSIS — M25611 Stiffness of right shoulder, not elsewhere classified: Secondary | ICD-10-CM | POA: Diagnosis not present

## 2015-05-11 DIAGNOSIS — M546 Pain in thoracic spine: Secondary | ICD-10-CM | POA: Diagnosis not present

## 2015-05-15 DIAGNOSIS — Z09 Encounter for follow-up examination after completed treatment for conditions other than malignant neoplasm: Secondary | ICD-10-CM | POA: Diagnosis not present

## 2015-05-16 ENCOUNTER — Other Ambulatory Visit: Payer: Self-pay | Admitting: Sports Medicine

## 2015-05-16 DIAGNOSIS — M25511 Pain in right shoulder: Secondary | ICD-10-CM | POA: Diagnosis not present

## 2015-05-16 DIAGNOSIS — M6281 Muscle weakness (generalized): Secondary | ICD-10-CM | POA: Diagnosis not present

## 2015-05-16 DIAGNOSIS — M546 Pain in thoracic spine: Secondary | ICD-10-CM | POA: Diagnosis not present

## 2015-05-16 DIAGNOSIS — M25611 Stiffness of right shoulder, not elsewhere classified: Secondary | ICD-10-CM | POA: Diagnosis not present

## 2015-05-22 ENCOUNTER — Ambulatory Visit: Payer: Medicare Other | Admitting: Sports Medicine

## 2015-05-23 DIAGNOSIS — M6281 Muscle weakness (generalized): Secondary | ICD-10-CM | POA: Diagnosis not present

## 2015-05-23 DIAGNOSIS — M546 Pain in thoracic spine: Secondary | ICD-10-CM | POA: Diagnosis not present

## 2015-05-23 DIAGNOSIS — M25511 Pain in right shoulder: Secondary | ICD-10-CM | POA: Diagnosis not present

## 2015-05-23 DIAGNOSIS — M25611 Stiffness of right shoulder, not elsewhere classified: Secondary | ICD-10-CM | POA: Diagnosis not present

## 2015-05-25 DIAGNOSIS — M546 Pain in thoracic spine: Secondary | ICD-10-CM | POA: Diagnosis not present

## 2015-05-25 DIAGNOSIS — M25511 Pain in right shoulder: Secondary | ICD-10-CM | POA: Diagnosis not present

## 2015-05-25 DIAGNOSIS — M6281 Muscle weakness (generalized): Secondary | ICD-10-CM | POA: Diagnosis not present

## 2015-05-25 DIAGNOSIS — M25611 Stiffness of right shoulder, not elsewhere classified: Secondary | ICD-10-CM | POA: Diagnosis not present

## 2015-09-01 ENCOUNTER — Other Ambulatory Visit: Payer: Self-pay

## 2015-09-01 MED ORDER — VENLAFAXINE HCL ER 75 MG PO CP24: ORAL_CAPSULE | ORAL | Status: DC

## 2015-09-14 DIAGNOSIS — S42201A Unspecified fracture of upper end of right humerus, initial encounter for closed fracture: Secondary | ICD-10-CM | POA: Diagnosis not present

## 2015-09-14 DIAGNOSIS — Z1159 Encounter for screening for other viral diseases: Secondary | ICD-10-CM | POA: Diagnosis not present

## 2015-09-14 DIAGNOSIS — M549 Dorsalgia, unspecified: Secondary | ICD-10-CM | POA: Diagnosis not present

## 2015-09-14 DIAGNOSIS — Z1231 Encounter for screening mammogram for malignant neoplasm of breast: Secondary | ICD-10-CM | POA: Diagnosis not present

## 2015-09-14 DIAGNOSIS — G894 Chronic pain syndrome: Secondary | ICD-10-CM | POA: Diagnosis not present

## 2015-09-14 DIAGNOSIS — F419 Anxiety disorder, unspecified: Secondary | ICD-10-CM | POA: Diagnosis not present

## 2015-09-14 DIAGNOSIS — E119 Type 2 diabetes mellitus without complications: Secondary | ICD-10-CM | POA: Diagnosis not present

## 2015-09-14 DIAGNOSIS — I2510 Atherosclerotic heart disease of native coronary artery without angina pectoris: Secondary | ICD-10-CM | POA: Diagnosis not present

## 2015-09-18 DIAGNOSIS — Z1231 Encounter for screening mammogram for malignant neoplasm of breast: Secondary | ICD-10-CM | POA: Diagnosis not present

## 2015-10-25 DIAGNOSIS — N183 Chronic kidney disease, stage 3 (moderate): Secondary | ICD-10-CM | POA: Diagnosis not present

## 2015-10-25 DIAGNOSIS — E1122 Type 2 diabetes mellitus with diabetic chronic kidney disease: Secondary | ICD-10-CM | POA: Diagnosis not present

## 2016-02-06 DIAGNOSIS — G4700 Insomnia, unspecified: Secondary | ICD-10-CM | POA: Diagnosis not present

## 2016-02-06 DIAGNOSIS — S8991XA Unspecified injury of right lower leg, initial encounter: Secondary | ICD-10-CM | POA: Diagnosis not present

## 2016-02-06 DIAGNOSIS — G709 Myoneural disorder, unspecified: Secondary | ICD-10-CM | POA: Diagnosis not present

## 2016-02-06 DIAGNOSIS — M797 Fibromyalgia: Secondary | ICD-10-CM | POA: Diagnosis not present

## 2016-02-06 DIAGNOSIS — R296 Repeated falls: Secondary | ICD-10-CM | POA: Diagnosis not present

## 2016-02-06 DIAGNOSIS — E785 Hyperlipidemia, unspecified: Secondary | ICD-10-CM | POA: Diagnosis not present

## 2016-02-06 DIAGNOSIS — E119 Type 2 diabetes mellitus without complications: Secondary | ICD-10-CM | POA: Diagnosis not present

## 2016-02-06 DIAGNOSIS — K219 Gastro-esophageal reflux disease without esophagitis: Secondary | ICD-10-CM | POA: Diagnosis not present

## 2016-02-06 DIAGNOSIS — R7989 Other specified abnormal findings of blood chemistry: Secondary | ICD-10-CM | POA: Diagnosis not present

## 2016-02-06 DIAGNOSIS — Z7982 Long term (current) use of aspirin: Secondary | ICD-10-CM | POA: Diagnosis not present

## 2016-02-06 DIAGNOSIS — Z79899 Other long term (current) drug therapy: Secondary | ICD-10-CM | POA: Diagnosis not present

## 2016-02-06 DIAGNOSIS — S0990XA Unspecified injury of head, initial encounter: Secondary | ICD-10-CM | POA: Diagnosis not present

## 2016-02-06 DIAGNOSIS — E876 Hypokalemia: Secondary | ICD-10-CM | POA: Diagnosis not present

## 2016-02-06 DIAGNOSIS — R9431 Abnormal electrocardiogram [ECG] [EKG]: Secondary | ICD-10-CM | POA: Diagnosis not present

## 2016-02-06 DIAGNOSIS — Z7984 Long term (current) use of oral hypoglycemic drugs: Secondary | ICD-10-CM | POA: Diagnosis not present

## 2016-02-06 DIAGNOSIS — G40909 Epilepsy, unspecified, not intractable, without status epilepticus: Secondary | ICD-10-CM | POA: Diagnosis not present

## 2016-02-12 ENCOUNTER — Other Ambulatory Visit: Payer: Self-pay | Admitting: Sports Medicine

## 2016-02-16 DIAGNOSIS — K219 Gastro-esophageal reflux disease without esophagitis: Secondary | ICD-10-CM | POA: Diagnosis not present

## 2016-02-16 DIAGNOSIS — S0990XA Unspecified injury of head, initial encounter: Secondary | ICD-10-CM | POA: Diagnosis not present

## 2016-02-16 DIAGNOSIS — R0789 Other chest pain: Secondary | ICD-10-CM | POA: Diagnosis not present

## 2016-02-16 DIAGNOSIS — S20212A Contusion of left front wall of thorax, initial encounter: Secondary | ICD-10-CM | POA: Diagnosis not present

## 2016-02-16 DIAGNOSIS — E1136 Type 2 diabetes mellitus with diabetic cataract: Secondary | ICD-10-CM | POA: Diagnosis not present

## 2016-02-16 DIAGNOSIS — E119 Type 2 diabetes mellitus without complications: Secondary | ICD-10-CM | POA: Diagnosis not present

## 2016-02-16 DIAGNOSIS — I700 Atherosclerosis of aorta: Secondary | ICD-10-CM | POA: Diagnosis not present

## 2016-02-16 DIAGNOSIS — Z7984 Long term (current) use of oral hypoglycemic drugs: Secondary | ICD-10-CM | POA: Diagnosis not present

## 2016-02-16 DIAGNOSIS — R079 Chest pain, unspecified: Secondary | ICD-10-CM | POA: Diagnosis not present

## 2016-02-16 DIAGNOSIS — Z7982 Long term (current) use of aspirin: Secondary | ICD-10-CM | POA: Diagnosis not present

## 2016-02-16 DIAGNOSIS — R296 Repeated falls: Secondary | ICD-10-CM | POA: Diagnosis not present

## 2016-02-16 DIAGNOSIS — Z79899 Other long term (current) drug therapy: Secondary | ICD-10-CM | POA: Diagnosis not present

## 2016-02-16 DIAGNOSIS — G709 Myoneural disorder, unspecified: Secondary | ICD-10-CM | POA: Diagnosis not present

## 2016-02-16 DIAGNOSIS — E785 Hyperlipidemia, unspecified: Secondary | ICD-10-CM | POA: Diagnosis not present

## 2016-02-22 DIAGNOSIS — I2510 Atherosclerotic heart disease of native coronary artery without angina pectoris: Secondary | ICD-10-CM | POA: Diagnosis not present

## 2016-02-22 DIAGNOSIS — N183 Chronic kidney disease, stage 3 (moderate): Secondary | ICD-10-CM | POA: Diagnosis not present

## 2016-02-22 DIAGNOSIS — R109 Unspecified abdominal pain: Secondary | ICD-10-CM | POA: Diagnosis not present

## 2016-02-22 DIAGNOSIS — Z9181 History of falling: Secondary | ICD-10-CM | POA: Diagnosis not present

## 2016-02-22 DIAGNOSIS — I10 Essential (primary) hypertension: Secondary | ICD-10-CM | POA: Diagnosis not present

## 2016-02-22 DIAGNOSIS — R7309 Other abnormal glucose: Secondary | ICD-10-CM | POA: Diagnosis not present

## 2016-02-22 DIAGNOSIS — E1122 Type 2 diabetes mellitus with diabetic chronic kidney disease: Secondary | ICD-10-CM | POA: Diagnosis not present

## 2016-02-22 DIAGNOSIS — Z789 Other specified health status: Secondary | ICD-10-CM | POA: Diagnosis not present

## 2016-02-22 DIAGNOSIS — E559 Vitamin D deficiency, unspecified: Secondary | ICD-10-CM | POA: Diagnosis not present

## 2016-02-22 DIAGNOSIS — E876 Hypokalemia: Secondary | ICD-10-CM | POA: Diagnosis not present

## 2016-02-22 DIAGNOSIS — E785 Hyperlipidemia, unspecified: Secondary | ICD-10-CM | POA: Diagnosis not present

## 2016-02-27 DIAGNOSIS — N183 Chronic kidney disease, stage 3 (moderate): Secondary | ICD-10-CM | POA: Diagnosis not present

## 2016-02-27 DIAGNOSIS — Z7984 Long term (current) use of oral hypoglycemic drugs: Secondary | ICD-10-CM | POA: Diagnosis not present

## 2016-02-27 DIAGNOSIS — F419 Anxiety disorder, unspecified: Secondary | ICD-10-CM | POA: Diagnosis not present

## 2016-02-27 DIAGNOSIS — E559 Vitamin D deficiency, unspecified: Secondary | ICD-10-CM | POA: Diagnosis not present

## 2016-02-27 DIAGNOSIS — Z9181 History of falling: Secondary | ICD-10-CM | POA: Diagnosis not present

## 2016-02-27 DIAGNOSIS — E1122 Type 2 diabetes mellitus with diabetic chronic kidney disease: Secondary | ICD-10-CM | POA: Diagnosis not present

## 2016-02-28 DIAGNOSIS — Z9181 History of falling: Secondary | ICD-10-CM | POA: Diagnosis not present

## 2016-02-28 DIAGNOSIS — N183 Chronic kidney disease, stage 3 (moderate): Secondary | ICD-10-CM | POA: Diagnosis not present

## 2016-02-28 DIAGNOSIS — F419 Anxiety disorder, unspecified: Secondary | ICD-10-CM | POA: Diagnosis not present

## 2016-02-28 DIAGNOSIS — Z7984 Long term (current) use of oral hypoglycemic drugs: Secondary | ICD-10-CM | POA: Diagnosis not present

## 2016-02-28 DIAGNOSIS — E1122 Type 2 diabetes mellitus with diabetic chronic kidney disease: Secondary | ICD-10-CM | POA: Diagnosis not present

## 2016-02-28 DIAGNOSIS — E559 Vitamin D deficiency, unspecified: Secondary | ICD-10-CM | POA: Diagnosis not present

## 2016-02-29 DIAGNOSIS — Z7984 Long term (current) use of oral hypoglycemic drugs: Secondary | ICD-10-CM | POA: Diagnosis not present

## 2016-02-29 DIAGNOSIS — N183 Chronic kidney disease, stage 3 (moderate): Secondary | ICD-10-CM | POA: Diagnosis not present

## 2016-02-29 DIAGNOSIS — E559 Vitamin D deficiency, unspecified: Secondary | ICD-10-CM | POA: Diagnosis not present

## 2016-02-29 DIAGNOSIS — Z9181 History of falling: Secondary | ICD-10-CM | POA: Diagnosis not present

## 2016-02-29 DIAGNOSIS — E1122 Type 2 diabetes mellitus with diabetic chronic kidney disease: Secondary | ICD-10-CM | POA: Diagnosis not present

## 2016-02-29 DIAGNOSIS — F419 Anxiety disorder, unspecified: Secondary | ICD-10-CM | POA: Diagnosis not present

## 2016-03-01 DIAGNOSIS — E559 Vitamin D deficiency, unspecified: Secondary | ICD-10-CM | POA: Diagnosis not present

## 2016-03-01 DIAGNOSIS — Z9181 History of falling: Secondary | ICD-10-CM | POA: Diagnosis not present

## 2016-03-01 DIAGNOSIS — F419 Anxiety disorder, unspecified: Secondary | ICD-10-CM | POA: Diagnosis not present

## 2016-03-01 DIAGNOSIS — N183 Chronic kidney disease, stage 3 (moderate): Secondary | ICD-10-CM | POA: Diagnosis not present

## 2016-03-01 DIAGNOSIS — Z7984 Long term (current) use of oral hypoglycemic drugs: Secondary | ICD-10-CM | POA: Diagnosis not present

## 2016-03-01 DIAGNOSIS — E1122 Type 2 diabetes mellitus with diabetic chronic kidney disease: Secondary | ICD-10-CM | POA: Diagnosis not present

## 2016-03-05 DIAGNOSIS — Z9181 History of falling: Secondary | ICD-10-CM | POA: Diagnosis not present

## 2016-03-05 DIAGNOSIS — E559 Vitamin D deficiency, unspecified: Secondary | ICD-10-CM | POA: Diagnosis not present

## 2016-03-05 DIAGNOSIS — N183 Chronic kidney disease, stage 3 (moderate): Secondary | ICD-10-CM | POA: Diagnosis not present

## 2016-03-05 DIAGNOSIS — F419 Anxiety disorder, unspecified: Secondary | ICD-10-CM | POA: Diagnosis not present

## 2016-03-05 DIAGNOSIS — E1122 Type 2 diabetes mellitus with diabetic chronic kidney disease: Secondary | ICD-10-CM | POA: Diagnosis not present

## 2016-03-05 DIAGNOSIS — Z7984 Long term (current) use of oral hypoglycemic drugs: Secondary | ICD-10-CM | POA: Diagnosis not present

## 2016-03-08 DIAGNOSIS — Z9181 History of falling: Secondary | ICD-10-CM | POA: Diagnosis not present

## 2016-03-08 DIAGNOSIS — Z7984 Long term (current) use of oral hypoglycemic drugs: Secondary | ICD-10-CM | POA: Diagnosis not present

## 2016-03-08 DIAGNOSIS — E559 Vitamin D deficiency, unspecified: Secondary | ICD-10-CM | POA: Diagnosis not present

## 2016-03-08 DIAGNOSIS — E1122 Type 2 diabetes mellitus with diabetic chronic kidney disease: Secondary | ICD-10-CM | POA: Diagnosis not present

## 2016-03-08 DIAGNOSIS — F419 Anxiety disorder, unspecified: Secondary | ICD-10-CM | POA: Diagnosis not present

## 2016-03-08 DIAGNOSIS — N183 Chronic kidney disease, stage 3 (moderate): Secondary | ICD-10-CM | POA: Diagnosis not present

## 2016-03-11 DIAGNOSIS — Z7984 Long term (current) use of oral hypoglycemic drugs: Secondary | ICD-10-CM | POA: Diagnosis not present

## 2016-03-11 DIAGNOSIS — E559 Vitamin D deficiency, unspecified: Secondary | ICD-10-CM | POA: Diagnosis not present

## 2016-03-11 DIAGNOSIS — F419 Anxiety disorder, unspecified: Secondary | ICD-10-CM | POA: Diagnosis not present

## 2016-03-11 DIAGNOSIS — N183 Chronic kidney disease, stage 3 (moderate): Secondary | ICD-10-CM | POA: Diagnosis not present

## 2016-03-11 DIAGNOSIS — Z9181 History of falling: Secondary | ICD-10-CM | POA: Diagnosis not present

## 2016-03-11 DIAGNOSIS — E1122 Type 2 diabetes mellitus with diabetic chronic kidney disease: Secondary | ICD-10-CM | POA: Diagnosis not present

## 2016-03-13 DIAGNOSIS — E1122 Type 2 diabetes mellitus with diabetic chronic kidney disease: Secondary | ICD-10-CM | POA: Diagnosis not present

## 2016-03-13 DIAGNOSIS — Z7984 Long term (current) use of oral hypoglycemic drugs: Secondary | ICD-10-CM | POA: Diagnosis not present

## 2016-03-13 DIAGNOSIS — F419 Anxiety disorder, unspecified: Secondary | ICD-10-CM | POA: Diagnosis not present

## 2016-03-13 DIAGNOSIS — N183 Chronic kidney disease, stage 3 (moderate): Secondary | ICD-10-CM | POA: Diagnosis not present

## 2016-03-13 DIAGNOSIS — Z9181 History of falling: Secondary | ICD-10-CM | POA: Diagnosis not present

## 2016-03-13 DIAGNOSIS — E559 Vitamin D deficiency, unspecified: Secondary | ICD-10-CM | POA: Diagnosis not present

## 2016-03-15 DIAGNOSIS — Z9181 History of falling: Secondary | ICD-10-CM | POA: Diagnosis not present

## 2016-03-15 DIAGNOSIS — N183 Chronic kidney disease, stage 3 (moderate): Secondary | ICD-10-CM | POA: Diagnosis not present

## 2016-03-15 DIAGNOSIS — E1122 Type 2 diabetes mellitus with diabetic chronic kidney disease: Secondary | ICD-10-CM | POA: Diagnosis not present

## 2016-03-15 DIAGNOSIS — Z7984 Long term (current) use of oral hypoglycemic drugs: Secondary | ICD-10-CM | POA: Diagnosis not present

## 2016-03-15 DIAGNOSIS — E559 Vitamin D deficiency, unspecified: Secondary | ICD-10-CM | POA: Diagnosis not present

## 2016-03-15 DIAGNOSIS — F419 Anxiety disorder, unspecified: Secondary | ICD-10-CM | POA: Diagnosis not present

## 2016-03-19 DIAGNOSIS — E876 Hypokalemia: Secondary | ICD-10-CM | POA: Diagnosis not present

## 2016-03-20 DIAGNOSIS — N183 Chronic kidney disease, stage 3 (moderate): Secondary | ICD-10-CM | POA: Diagnosis not present

## 2016-03-20 DIAGNOSIS — Z7984 Long term (current) use of oral hypoglycemic drugs: Secondary | ICD-10-CM | POA: Diagnosis not present

## 2016-03-20 DIAGNOSIS — Z9181 History of falling: Secondary | ICD-10-CM | POA: Diagnosis not present

## 2016-03-20 DIAGNOSIS — F419 Anxiety disorder, unspecified: Secondary | ICD-10-CM | POA: Diagnosis not present

## 2016-03-20 DIAGNOSIS — E1122 Type 2 diabetes mellitus with diabetic chronic kidney disease: Secondary | ICD-10-CM | POA: Diagnosis not present

## 2016-03-20 DIAGNOSIS — E559 Vitamin D deficiency, unspecified: Secondary | ICD-10-CM | POA: Diagnosis not present

## 2016-04-25 DIAGNOSIS — F419 Anxiety disorder, unspecified: Secondary | ICD-10-CM | POA: Diagnosis not present

## 2016-04-25 DIAGNOSIS — I10 Essential (primary) hypertension: Secondary | ICD-10-CM | POA: Diagnosis not present

## 2016-04-25 DIAGNOSIS — I2510 Atherosclerotic heart disease of native coronary artery without angina pectoris: Secondary | ICD-10-CM | POA: Diagnosis not present

## 2016-04-25 DIAGNOSIS — E785 Hyperlipidemia, unspecified: Secondary | ICD-10-CM | POA: Diagnosis not present

## 2016-04-25 DIAGNOSIS — E559 Vitamin D deficiency, unspecified: Secondary | ICD-10-CM | POA: Diagnosis not present

## 2016-04-25 DIAGNOSIS — E876 Hypokalemia: Secondary | ICD-10-CM | POA: Diagnosis not present

## 2016-05-02 DIAGNOSIS — E119 Type 2 diabetes mellitus without complications: Secondary | ICD-10-CM | POA: Diagnosis not present

## 2016-05-02 DIAGNOSIS — Z6835 Body mass index (BMI) 35.0-35.9, adult: Secondary | ICD-10-CM | POA: Diagnosis not present

## 2016-05-02 DIAGNOSIS — E669 Obesity, unspecified: Secondary | ICD-10-CM | POA: Diagnosis not present

## 2016-05-02 DIAGNOSIS — I10 Essential (primary) hypertension: Secondary | ICD-10-CM | POA: Diagnosis not present

## 2016-05-02 DIAGNOSIS — I2510 Atherosclerotic heart disease of native coronary artery without angina pectoris: Secondary | ICD-10-CM | POA: Diagnosis not present

## 2016-08-10 ENCOUNTER — Other Ambulatory Visit: Payer: Self-pay | Admitting: Sports Medicine

## 2016-11-09 ENCOUNTER — Other Ambulatory Visit: Payer: Self-pay | Admitting: Sports Medicine

## 2016-11-19 DIAGNOSIS — E559 Vitamin D deficiency, unspecified: Secondary | ICD-10-CM | POA: Diagnosis not present

## 2016-11-19 DIAGNOSIS — E785 Hyperlipidemia, unspecified: Secondary | ICD-10-CM | POA: Diagnosis not present

## 2016-11-19 DIAGNOSIS — M4120 Other idiopathic scoliosis, site unspecified: Secondary | ICD-10-CM | POA: Diagnosis not present

## 2016-11-19 DIAGNOSIS — N183 Chronic kidney disease, stage 3 (moderate): Secondary | ICD-10-CM | POA: Diagnosis not present

## 2016-11-19 DIAGNOSIS — I10 Essential (primary) hypertension: Secondary | ICD-10-CM | POA: Diagnosis not present

## 2016-11-19 DIAGNOSIS — E1122 Type 2 diabetes mellitus with diabetic chronic kidney disease: Secondary | ICD-10-CM | POA: Diagnosis not present

## 2016-11-19 DIAGNOSIS — I2510 Atherosclerotic heart disease of native coronary artery without angina pectoris: Secondary | ICD-10-CM | POA: Diagnosis not present

## 2016-11-19 DIAGNOSIS — E876 Hypokalemia: Secondary | ICD-10-CM | POA: Diagnosis not present

## 2016-11-19 DIAGNOSIS — F419 Anxiety disorder, unspecified: Secondary | ICD-10-CM | POA: Diagnosis not present

## 2016-11-19 DIAGNOSIS — M797 Fibromyalgia: Secondary | ICD-10-CM | POA: Diagnosis not present

## 2016-11-28 ENCOUNTER — Ambulatory Visit (INDEPENDENT_AMBULATORY_CARE_PROVIDER_SITE_OTHER): Payer: Medicare Other | Admitting: Sports Medicine

## 2016-11-28 ENCOUNTER — Ambulatory Visit (INDEPENDENT_AMBULATORY_CARE_PROVIDER_SITE_OTHER): Payer: Medicare Other

## 2016-11-28 ENCOUNTER — Encounter: Payer: Self-pay | Admitting: Sports Medicine

## 2016-11-28 DIAGNOSIS — M542 Cervicalgia: Secondary | ICD-10-CM | POA: Diagnosis not present

## 2016-11-28 DIAGNOSIS — M4696 Unspecified inflammatory spondylopathy, lumbar region: Secondary | ICD-10-CM

## 2016-11-28 DIAGNOSIS — M47816 Spondylosis without myelopathy or radiculopathy, lumbar region: Secondary | ICD-10-CM

## 2016-11-28 DIAGNOSIS — M5030 Other cervical disc degeneration, unspecified cervical region: Secondary | ICD-10-CM

## 2016-11-28 MED ORDER — PREDNISONE 50 MG PO TABS: ORAL_TABLET | ORAL | 0 refills | Status: DC

## 2016-11-28 MED ORDER — MELOXICAM 15 MG PO TABS: ORAL_TABLET | ORAL | 3 refills | Status: DC

## 2016-11-28 NOTE — Progress Notes (Signed)
   Subjective:    I'm seeing this patient as a consultation for:  Primus Bravoiffany Gibson, NP  CC: Back pain  HPI: This is a pleasant 71 year old female, I treated her in the past for lumbar facet arthritis, she responded extremely well to L3-S1 facet joint injections about a year and a half ago, and then 10 months before that. She is having a slight recurrence of this pain but more for pain is right periscapular radiating from the neck. We did get some cervical spine imaging about 2 years ago that showed significant multilevel cervical spinal stenosis. [place] and is going to be partying for 2 days straight. She is agreeable to start conservatively for this.  Past medical history, Surgical history, Family history not pertinant except as noted below, Social history, Allergies, and medications have been entered into the medical record, reviewed, and no changes needed.   Review of Systems: No headache, visual changes, nausea, vomiting, diarrhea, constipation, dizziness, abdominal pain, skin rash, fevers, chills, night sweats, weight loss, swollen lymph nodes, body aches, joint swelling, muscle aches, chest pain, shortness of breath, mood changes, visual or auditory hallucinations.   Objective:   General: Well Developed, well nourished, and in no acute distress.  Neuro:  Extra-ocular muscles intact, able to move all 4 extremities, sensation grossly intact.  Deep tendon reflexes tested were normal. Psych: Alert and oriented, mood congruent with affect. ENT:  Ears and nose appear unremarkable.  Hearing grossly normal. Neck: Unremarkable overall appearance, trachea midline.  No visible thyroid enlargement. Eyes: Conjunctivae and lids appear unremarkable.  Pupils equal and round. Skin: Warm and dry, no rashes noted.  Cardiovascular: Pulses palpable, no extremity edema. Neck: Negative spurling's Full neck range of motion Grip strength and sensation normal in  bilateral hands Strength good C4 to T1 distribution No sensory change to C4 to T1 Reflexes normal Tenderness around the right periscapular region Also has a bit of tenderness over the left lower lumbar region  Impression and Recommendations:   This case required medical decision making of moderate complexity.  DDD (degenerative disc disease), cervical Multilevel cervical degenerative disc disease and facet arthritis on MRI from 2 years ago. Having right sided periscapular radicular symptoms.  We are to start conservatively, prednisone, meloxicam, formal PT. I would like an updated set of x-rays.  Facet arthritis of lumbar region Has had fantastic response  several years ago to a left-sided L3-L4 facet joint injection. We actually injected L3-S1. She is having a bit of the same pain, but abdominal hold off until after physical therapy and prednisone before proceeding with repeat L3-S1 facet joint injections on the left. She does have a trip coming up to go to an [place] wedding.  ___________________________________________ Ihor Austinhomas J. Benjamin Stainhekkekandam, M.D., ABFM., CAQSM. Primary Care and Sports Medicine [facility]  Adjunct Instructor of Family Medicine  [employer]

## 2016-11-28 NOTE — Assessment & Plan Note (Addendum)
Multilevel cervical degenerative disc disease and facet arthritis on MRI from 2 years ago. Having right sided periscapular radicular symptoms.  We are to start conservatively, prednisone, meloxicam, formal PT. I would like an updated set of x-rays.

## 2016-11-28 NOTE — Assessment & Plan Note (Signed)
Has had fantastic response  several years ago to a left-sided L3-L4 facet joint injection. We actually injected L3-S1. She is having a bit of the same pain, but abdominal hold off until after physical therapy and prednisone before proceeding with repeat L3-S1 facet joint injections on the left. She does have a trip coming up to go to an [place].

## 2016-11-28 NOTE — Addendum Note (Signed)
Addended by: Baird KayUGLAS, Gary Gabrielsen M on: 11/28/2016 02:20 PM   Modules accepted: Orders

## 2016-12-10 ENCOUNTER — Ambulatory Visit (INDEPENDENT_AMBULATORY_CARE_PROVIDER_SITE_OTHER): Payer: Medicare Other | Admitting: Physical Therapy

## 2016-12-10 ENCOUNTER — Encounter: Payer: Self-pay | Admitting: Physical Therapy

## 2016-12-10 DIAGNOSIS — R29898 Other symptoms and signs involving the musculoskeletal system: Secondary | ICD-10-CM | POA: Diagnosis not present

## 2016-12-10 DIAGNOSIS — R293 Abnormal posture: Secondary | ICD-10-CM | POA: Diagnosis not present

## 2016-12-10 DIAGNOSIS — M6281 Muscle weakness (generalized): Secondary | ICD-10-CM | POA: Diagnosis not present

## 2016-12-10 NOTE — Therapy (Signed)
[facility] [place] [address] [place], [place], [address] Phone: [phone]   Fax:  [phone]  Physical Therapy Evaluation  Patient Details  Name: Kristina Shea MRN: [id number] Date of Birth: 19-Jul-1945 Referring Provider: Dr Benjamin Stain  Encounter Date: 12/10/2016      PT End of Session - 12/10/16 1151    Visit Number 1   Number of Visits 16   Date for PT Re-Evaluation 02/04/17   PT Start Time 1151   PT Stop Time 1245   PT Time Calculation (min) 54 min      Past Medical History:  Diagnosis Date  . Arthritis   . Diabetes mellitus without complication (HCC)    type 2  . Fibromyalgia   . History of hiatal hernia   . Hypertension   . IBS (irritable bowel syndrome)   . Pneumonia   . Sleep apnea    many years ago, does not use cpap    Past Surgical History:  Procedure Laterality Date  . ABDOMINAL HYSTERECTOMY    . COLONOSCOPY    . EYE SURGERY Bilateral    cataract surgery with lens implant  . FRACTURE SURGERY     tendon surgery left little finger  . ORIF HUMERUS FRACTURE Right 11/17/2014   Procedure: OPEN REDUCTION INTERNAL FIXATION (ORIF) PROXIMAL HUMERUS FRACTURE;  Surgeon: Jones Broom, MD;  Location: [facility] OR;  Service: Orthopedics;  Laterality: Right;  Open reduction internal fixation proximal humerus fracture right shoulder  . TONSILLECTOMY      There were no vitals filed for this visit.       Subjective Assessment - 12/10/16 1152    Subjective Pt reports her scoliosis has gotten really bad after having the bad accident 6 yrs ago. She initially was using meds to help control and never had her try PT. The pain has progressed  after her epidural. in 2017.  Had falls in Nov and Dec, no falls since then.  Usually used a cane.     How long can you sit comfortably? ok in her chair at home.    How long can you walk comfortably? household ambulation only for safety.  Has to lean on cart at store    Patient Stated Goals walk better, and perform house activites easier.    Currently in Pain? Yes   Pain Score 7    Pain Location Thoracic   Pain Orientation Left;Right   Pain Descriptors / Indicators Sharp   Pain Type Chronic pain   Pain Onset More than a month ago   Pain Frequency Constant   Aggravating Factors  over doing activity   Pain Relieving Factors not sure             [facility] PT Assessment - 12/10/16 0001      Assessment   Medical Diagnosis cervical and lumbar DDD   Referring Provider Dr Benjamin Stain   Onset Date/Surgical Date 03/11/16   Hand Dominance --  both   Next MD Visit PRN   Prior Therapy none     Precautions   Precautions None     Balance Screen   Has the patient fallen in the past 6 months No     Home Environment   Living Environment Private residence   Living Arrangements Alone  has assistance if needed   Home Access Level entry     Prior Function   Level of Independence Independent   Vocation Retired   Leisure sedentary life style, play bridge, crochet, read,  watch TV      Observation/Other Assessments   Focus on Therapeutic Outcomes (FOTO)  66% limited     Posture/Postural Control   Posture/Postural Control Postural limitations   Postural Limitations Forward head;Rounded Shoulders;Right pelvic obliquity;Left pelvic obliquity;Weight shift right   Posture Comments REEDCO posture score 45/100 , in supine/prone patient had straight spine.      ROM / Strength   AROM / PROM / Strength AROM;Strength     AROM   Overall AROM Comments dorsiflexion Rt 12, Lt 13 , bilat hip ext WNL.   AROM Assessment Site Shoulder;Cervical;Lumbar   Right/Left Shoulder --  flexion 140 bilat, IR to bra, ER behind head.    Cervical Flexion 57   Cervical Extension 67   Cervical - Right Rotation 84   Cervical - Left Rotation 73   Lumbar Flexion to floor    Lumbar Extension 25% present   Lumbar - Right Rotation 40% with pain   Lumbar - Left Rotation 10% with pain      Strength   Strength Assessment Site Shoulder;Elbow;Hip;Knee;Ankle;Lumbar   Right/Left Shoulder --  WNL   Right/Left Elbow --  WNL   Right/Left Hip Right;Left   Right Hip Flexion 5/5   Right Hip Extension 5/5   Right Hip ABduction 4-/5   Left Hip Flexion 5/5   Left Hip Extension 5/5   Left Hip ABduction 4-/5   Right/Left Knee --  WNL   Right/Left Ankle --  WNL   Lumbar Flexion --  TA poor   Lumbar Extension --  multifidi fair            Objective measurements completed on examination: See above findings.          [facility] Adult PT Treatment/Exercise - 12/10/16 0001      Exercises   Exercises Lumbar     Lumbar Exercises: Supine   Other Supine Lumbar Exercises MEEKs postural realignment program     Lumbar Exercises: Prone   Opposite Arm/Leg Raise Left arm/Right leg;Right arm/Left leg  3 reps                PT Education - 12/10/16 1226    Education provided Yes   Education Details HEP   Person(s) Educated Patient   Methods Explanation;Demonstration;Handout   Comprehension Returned demonstration;Verbalized understanding          PT Short Term Goals - 12/10/16 1252      PT SHORT TERM GOAL #1   Title I with initial HEP for postural realignment ( 01/21/17)    Time 4   Period Weeks   Status New     PT SHORT TERM GOAL #2   Title report =/> 25% reduction in back pain ( 01/21/17)    Time 4   Period Weeks   Status New     PT SHORT TERM GOAL #3   Title improve FOTO =/< 55% limited ( 01/21/17)    Time 4   Period Weeks   Status New     PT SHORT TERM GOAL #4   Title improve REEDCO posture score =/> 65/100 ( 01/21/17)    Time 4   Period Weeks   Status New           PT Long Term Goals - 12/10/16 1255      PT LONG TERM GOAL #1   Title I with advanced HEP to include gentle resistance with bands and body weight ( 02/04/17)    Time 8   Period  Weeks   Status New     PT LONG TERM GOAL #2   Title report =/> 50% improvement in back  pain to allow her to perform household chores easier ( 02/04/17)    Time 8   Period Weeks   Status New     PT LONG TERM GOAL #3   Title improve REEDCO posture score =/> 80/100 to allow for upright posture with walking ( 02/04/17)    Time 8   Period Weeks   Status New     PT LONG TERM GOAL #4   Title improve FOTO =/< 48% limited, CK level ( 02/04/17)    Time 8   Period Weeks   Status New     PT LONG TERM GOAL #5   Title demo bilat hip abduction =/> 5-/5 ( 02/04/17)    Time 8   Period Weeks   Status New                Plan - 12/19/2016 1246    Clinical Impression Statement 71 yo female with degenerative changes in her spine.  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 She has some core weakness and  tightness causing alignment problems. These caus pain and limit her ability to tolerate upright postures and perform IADLs. Progress will be slow due to other dx    History and Personal Factors relevant to plan of care: fibromyalgia,  osteoporosis, h/o falls last year, DM,    Clinical Presentation Evolving   Clinical Decision Making Moderate   Rehab Potential Excellent   PT Frequency 2x / week   PT Duration 8 weeks   PT Treatment/Interventions Moist Heat;Ultrasound;Therapeutic exercise;Dry needling;Manual techniques;Neuromuscular re-education;Cryotherapy;Electrical Stimulation;Patient/family education;Passive range of motion   PT Next Visit Plan MEEKs postural realignment progression, working in supine and prone initially.  Modalities and manual work PRN for pain.    Consulted and Agree with Plan of Care Patient      Patient will benefit from skilled therapeutic intervention in order to improve the following deficits and impairments:  Difficulty walking, Increased muscle spasms, Pain, Improper body mechanics, Postural dysfunction, Decreased strength  Visit Diagnosis: Abnormal posture - Plan: PT plan of care  cert/re-cert  Muscle weakness (generalized) - Plan: PT plan of care cert/re-cert  Other symptoms and signs involving the musculoskeletal system - Plan: PT plan of care cert/re-cert      [facility] PT PB G-CODES - 12-19-2016 1258    Functional Assessment Tool Used  FOTO, REEDCO and professional judgement    Functional Limitations Other PT primary   Other PT Primary Current Status [phone]) At least 60 percent but less than 80 percent impaired, limited or restricted   Other PT Primary Goal Status ([id number]) At least 40 percent but less than 60 percent impaired, limited or restricted       Problem List Patient Active Problem List   Diagnosis Date Noted  . S/P ORIF (open reduction internal fixation) right proximal humeral fracture 11/17/2014  . Fracture of humerus, proximal, right, closed 11/16/2014  . Closed fracture of humerus, upper end 11/09/2014  . Osteoporosis 04/21/2014  . Facet arthritis of lumbar region (HCC) 04/12/2014  . Scoliosis (and kyphoscoliosis), idiopathic 03/28/2014  . Left cervical radiculopathy 03/28/2014  . Fibromyalgia 03/28/2014  . Foot pain 02/09/2013  . Intracranial subdural hematoma (HCC) 01/06/2013  . Absolute anemia 01/06/2013  . Narcotic drug use 10/15/2012  . Impaired renal function 07/16/2012  . DDD (degenerative disc  disease), cervical 07/16/2012  . Gout 06/15/2012  . HLD (hyperlipidemia) 01/02/2012  . Carotid arterial disease (HCC) 01/02/2012  . Anxiety 01/02/2012  . Carotid artery disease (HCC) 01/02/2012  . Scoliosis 01/02/2012  . Calcification of coronary artery 11/02/2011  . Diabetes mellitus, type 2 (HCC) 08/30/2011  . Polypharmacy 08/30/2011  . Abdominal skin ptosis 08/30/2011  . Type 2 diabetes mellitus (HCC) 08/30/2011  . Blurred vision 01/31/2011  . VFD (visual field defect) 01/31/2011  . Cannot sleep 01/09/2011  . Essential (primary) hypertension 12/09/2007  . Fibrositis 12/09/2007  . Avitaminosis D 04/10/2007    Roderic Scarce PT   12/10/2016, 1:03 PM  [facility] [address] [place], [place], [address] Phone: [phone]   Fax:  [phone]  Name: Kristina Shea MRN: [id number] Date of Birth: Feb 22, 1946

## 2016-12-10 NOTE — Patient Instructions (Addendum)
Arm / Leg Lift: Opposite (Prone) - use pillow under hip for comfort if needed.     Lift right leg and opposite arm off floor, keeping knee locked. Repeat __10__ times per set. Do __1-2__ sets per session. Do __1__ sessions per day.  Decompression Exercise: Head Support    Lie on back on firm surface, knees bent, feet flat, arms turned up, out to sides, backs of hands down. Support under head: pillow. Time _1-3__ minutes. Surface: floor   Head Press With Chin Tuck - pillow under head to keep neck in a straight position.     Tuck chin SLIGHTLY toward chest, keep mouth closed. Feel weight on back of head. Increase weight by pressing head down. Hold __3-5_ seconds. Relax. Repeat _10__ times. Surface: floor if able   Shoulder Press    Press both shoulders down. Hold 3-5___ seconds. Repeat _10__ times. Press one shoulder down. Hold _3-5__ seconds Repeat _10__ times. Do other shoulder. If unable to press one or both shoulders, lie in position a few sessions until you can. Surface: floor  If able, once a day.    Leg Lengthener: Full    Straighten one leg. Pull toes AND forefoot toward knee, extend heel. Lengthen leg by pulling pelvis away from ribs. Hold _3-5__ seconds. Relax. Repeat 10 times. Re-bend knee. Do other leg. Each leg __10_ times. Surface: floor if able,once a day.    Leg Press: Double    Straighten BOTH legs, one at a time, down to floor. Bring toes AND forefeet toward knees, extend heels. Press BOTH legs down. DO NOT BEND KNEES. Hold _3-5__ seconds. Relax legs. Re-bend knees one at a time to starting position. Repeat __10_ times each side. On floor is able, once a day.

## 2016-12-13 ENCOUNTER — Ambulatory Visit (INDEPENDENT_AMBULATORY_CARE_PROVIDER_SITE_OTHER): Payer: Medicare Other | Admitting: Rehabilitative and Restorative Service Providers"

## 2016-12-13 ENCOUNTER — Encounter: Payer: Self-pay | Admitting: Rehabilitative and Restorative Service Providers"

## 2016-12-13 ENCOUNTER — Encounter: Payer: Medicare Other | Admitting: Physical Therapy

## 2016-12-13 DIAGNOSIS — R29898 Other symptoms and signs involving the musculoskeletal system: Secondary | ICD-10-CM | POA: Diagnosis not present

## 2016-12-13 DIAGNOSIS — R293 Abnormal posture: Secondary | ICD-10-CM

## 2016-12-13 DIAGNOSIS — M6281 Muscle weakness (generalized): Secondary | ICD-10-CM | POA: Diagnosis not present

## 2016-12-13 NOTE — Patient Instructions (Signed)
TENS UNIT: This is helpful for muscle pain and spasm.   Search and Purchase a TENS 7000 2nd edition at www.tenspros.com. It should be less than $30.     TENS unit instructions: Do not shower or bathe with the unit on Turn the unit off before removing electrodes or batteries If the electrodes lose stickiness add a drop of water to the electrodes after they are disconnected from the unit and place on plastic sheet. If you continued to have difficulty, call the TENS unit company to purchase more electrodes. Do not apply lotion on the skin area prior to use. Make sure the skin is clean and dry as this will help prolong the life of the electrodes. After use, always check skin for unusual red areas, rash or other skin difficulties. If there are any skin problems, does not apply electrodes to the same area. Never remove the electrodes from the unit by pulling the wires. Do not use the TENS unit or electrodes other than as directed. Do not change electrode placement without consultating your therapist or physician. Keep 2 fingers with between each electrode.                      Arm / Leg Lift: Opposite (Prone) - use pillow under hip for comfort if needed.     Lift right leg and opposite arm off floor, keeping knee locked. Repeat __10__ times per set. Do __1-2__ sets per session. Do __1__ sessions per day.  Decompression Exercise: Head Support    Lie on back on firm surface, knees bent, feet flat, arms turned up, out to sides, backs of hands down. Support under head: pillow. Time _1-3__ minutes. Surface: floor   Head Press With Chin Tuck - pillow under head to keep neck in a straight position.     Tuck chin SLIGHTLY toward chest, keep mouth closed. Feel weight on back of head. Increase weight by pressing head down. Hold __3-5_ seconds. Relax. Repeat _10__ times. Surface: floor if able   Shoulder Press    Press both shoulders down. Hold 3-5___ seconds. Repeat _10__ times.  Press one shoulder down. Hold _3-5__ seconds Repeat _10__ times. Do other shoulder. If unable to press one or both shoulders, lie in position a few sessions until you can. Surface: floor  If able, once a day.    Leg Lengthener: Full    Straighten one leg. Pull toes AND forefoot toward knee, extend heel. Lengthen leg by pulling pelvis away from ribs. Hold _3-5__ seconds. Relax. Repeat 10 times. Re-bend knee. Do other leg. Each leg __10_ times. Surface: floor if able,once a day.    Leg Press: Double    Straighten BOTH legs, one at a time, down to floor. Bring toes AND forefeet toward knees, extend heels. Press BOTH legs down. DO NOT BEND KNEES. Hold _3-5__ seconds. Relax legs. Re-bend knees one at a time to starting position. Repeat __10_ times each side. On floor is able, once a day.        I

## 2016-12-13 NOTE — Therapy (Signed)
[facility] [place] [address] [place], [place], [address] Phone: [phone]   Fax:  [phone]  Physical Therapy Treatment  Patient Details  Name: Kristina Shea MRN: [id number] Date of Birth: 01/22/1946 Referring Provider: Dr Benjamin Stain  Encounter Date: 12/13/2016      PT End of Session - 12/13/16 0902    Visit Number 2   Number of Visits 16   Date for PT Re-Evaluation 02/04/17   PT Start Time 0849   PT Stop Time 0940   PT Time Calculation (min) 51 min   Activity Tolerance Patient tolerated treatment well      Past Medical History:  Diagnosis Date  . Arthritis   . Diabetes mellitus without complication (HCC)    type 2  . Fibromyalgia   . History of hiatal hernia   . Hypertension   . IBS (irritable bowel syndrome)   . Pneumonia   . Sleep apnea    many years ago, does not use cpap    Past Surgical History:  Procedure Laterality Date  . ABDOMINAL HYSTERECTOMY    . COLONOSCOPY    . EYE SURGERY Bilateral    cataract surgery with lens implant  . FRACTURE SURGERY     tendon surgery left little finger  . ORIF HUMERUS FRACTURE Right 11/17/2014   Procedure: OPEN REDUCTION INTERNAL FIXATION (ORIF) PROXIMAL HUMERUS FRACTURE;  Surgeon: Jones Broom, MD;  Location: [facility] OR;  Service: Orthopedics;  Laterality: Right;  Open reduction internal fixation proximal humerus fracture right shoulder  . TONSILLECTOMY      There were no vitals filed for this visit.      Subjective Assessment - 12/13/16 0851    Subjective Patient reports significant increase in pain following the initial treatment. "hasn't hurt like that in a long time". Symtpoms are still flared up. Has doen a little bit of stretching and some exercising.    Currently in Pain? Yes   Pain Score 8    Pain Location Thoracic   Pain Orientation Left;Right   Pain Descriptors / Indicators Sharp   Pain Type Chronic pain   Pain Onset More than a month ago    Pain Frequency Constant                         [facility] Adult PT Treatment/Exercise - 12/13/16 0001      Self-Care   Self-Care --  discussion of exercise and chronic pain; TENS      Lumbar Exercises: Supine   Ab Set --  trial of 4 part core 10 sec x 10    Clam 10 reps   Other Supine Lumbar Exercises MEEKs postural realignment program   Other Supine Lumbar Exercises alternate shoulder flexion; nodding head yes/no; turning head side to side  x 10 each      Lumbar Exercises: Prone   Opposite Arm/Leg Raise Left arm/Right leg;Right arm/Left leg  3 reps     Moist Heat Therapy   Number Minutes Moist Heat 20 Minutes   Moist Heat Location Cervical;Lumbar Spine  thoracic      Electrical Stimulation   Electrical Stimulation Location Rt thoracic spine    Electrical Stimulation Action IFC   Electrical Stimulation Parameters to tolerance   Electrical Stimulation Goals Pain                PT Education - 12/13/16 0902    Education provided Yes   Education Details TENS    Person(s)  Educated Patient   Methods Explanation   Comprehension Verbalized understanding          PT Short Term Goals - 12/10/16 1252      PT SHORT TERM GOAL #1   Title I with initial HEP for postural realignment ( 01/21/17)    Time 4   Period Weeks   Status New     PT SHORT TERM GOAL #2   Title report =/> 25% reduction in back pain ( 01/21/17)    Time 4   Period Weeks   Status New     PT SHORT TERM GOAL #3   Title improve FOTO =/< 55% limited ( 01/21/17)    Time 4   Period Weeks   Status New     PT SHORT TERM GOAL #4   Title improve REEDCO posture score =/> 65/100 ( 01/21/17)    Time 4   Period Weeks   Status New           PT Long Term Goals - 12/10/16 1255      PT LONG TERM GOAL #1   Title I with advanced HEP to include gentle resistance with bands and body weight ( 02/04/17)    Time 8   Period Weeks   Status New     PT LONG TERM GOAL #2   Title report =/>  50% improvement in back pain to allow her to perform household chores easier ( 02/04/17)    Time 8   Period Weeks   Status New     PT LONG TERM GOAL #3   Title improve REEDCO posture score =/> 80/100 to allow for upright posture with walking ( 02/04/17)    Time 8   Period Weeks   Status New     PT LONG TERM GOAL #4   Title improve FOTO =/< 48% limited, CK level ( 02/04/17)    Time 8   Period Weeks   Status New     PT LONG TERM GOAL #5   Title demo bilat hip abduction =/> 5-/5 ( 02/04/17)    Time 8   Period Weeks   Status New               Plan - 12/13/16 1610    Clinical Impression Statement Patient presents with flare up of pain following initiat visit. Reviewed exercises and discussed exercise importance with treating and controlling chronic pain. Trial of e-stim to address pain.    Rehab Potential Excellent   PT Frequency 2x / week   PT Duration 8 weeks   PT Treatment/Interventions Moist Heat;Ultrasound;Therapeutic exercise;Dry needling;Manual techniques;Neuromuscular re-education;Cryotherapy;Electrical Stimulation;Patient/family education;Passive range of motion   PT Next Visit Plan MEEKs postural realignment progression, working in supine and prone initially.  Modalities and manual work PRN for pain. Assess response to e-stim     Consulted and Agree with Plan of Care Patient      Patient will benefit from skilled therapeutic intervention in order to improve the following deficits and impairments:  Difficulty walking, Increased muscle spasms, Pain, Improper body mechanics, Postural dysfunction, Decreased strength  Visit Diagnosis: Abnormal posture  Muscle weakness (generalized)  Other symptoms and signs involving the musculoskeletal system     Problem List Patient Active Problem List   Diagnosis Date Noted  . S/P ORIF (open reduction internal fixation) right proximal humeral fracture 11/17/2014  . Fracture of humerus, proximal, right, closed 11/16/2014   . Closed fracture of humerus, upper end 11/09/2014  . Osteoporosis 04/21/2014  .  Facet arthritis of lumbar region (HCC) 04/12/2014  . Scoliosis (and kyphoscoliosis), idiopathic 03/28/2014  . Left cervical radiculopathy 03/28/2014  . Fibromyalgia 03/28/2014  . Foot pain 02/09/2013  . Intracranial subdural hematoma (HCC) 01/06/2013  . Absolute anemia 01/06/2013  . Narcotic drug use 10/15/2012  . Impaired renal function 07/16/2012  . DDD (degenerative disc disease), cervical 07/16/2012  . Gout 06/15/2012  . HLD (hyperlipidemia) 01/02/2012  . Carotid arterial disease (HCC) 01/02/2012  . Anxiety 01/02/2012  . Carotid artery disease (HCC) 01/02/2012  . Scoliosis 01/02/2012  . Calcification of coronary artery 11/02/2011  . Diabetes mellitus, type 2 (HCC) 08/30/2011  . Polypharmacy 08/30/2011  . Abdominal skin ptosis 08/30/2011  . Type 2 diabetes mellitus (HCC) 08/30/2011  . Blurred vision 01/31/2011  . VFD (visual field defect) 01/31/2011  . Cannot sleep 01/09/2011  . Essential (primary) hypertension 12/09/2007  . Fibrositis 12/09/2007  . Avitaminosis D 04/10/2007    Casara Perrier Rober Minion PT. MPH  12/13/2016, 10:34 AM  [facility] [address] [place], [place], [address] Phone: [phone]   Fax:  [phone]  Name: Kristina Shea MRN: [id number] Date of Birth: Feb 01, 1946

## 2016-12-16 ENCOUNTER — Ambulatory Visit (INDEPENDENT_AMBULATORY_CARE_PROVIDER_SITE_OTHER): Payer: Medicare Other | Admitting: Physical Therapy

## 2016-12-16 DIAGNOSIS — R29898 Other symptoms and signs involving the musculoskeletal system: Secondary | ICD-10-CM

## 2016-12-16 DIAGNOSIS — M6281 Muscle weakness (generalized): Secondary | ICD-10-CM

## 2016-12-16 DIAGNOSIS — R293 Abnormal posture: Secondary | ICD-10-CM

## 2016-12-16 NOTE — Therapy (Signed)
[facility] [place] [address] [place], [place], [address] Phone: [phone]   Fax:  [phone]  Physical Therapy Treatment  Patient Details  Name: Kristina Shea MRN: [id number] Date of Birth: 1945-08-01 Referring Provider: Dr. Benjamin Stain  Encounter Date: 12/16/2016      PT End of Session - 12/16/16 1404    Visit Number 3   Number of Visits 16   Date for PT Re-Evaluation 02/04/17   PT Start Time 1402   PT Stop Time 1454   PT Time Calculation (min) 52 min   Activity Tolerance Patient tolerated treatment well   Behavior During Therapy [facility] for tasks assessed/performed      Past Medical History:  Diagnosis Date  . Arthritis   . Diabetes mellitus without complication (HCC)    type 2  . Fibromyalgia   . History of hiatal hernia   . Hypertension   . IBS (irritable bowel syndrome)   . Pneumonia   . Sleep apnea    many years ago, does not use cpap    Past Surgical History:  Procedure Laterality Date  . ABDOMINAL HYSTERECTOMY    . COLONOSCOPY    . EYE SURGERY Bilateral    cataract surgery with lens implant  . FRACTURE SURGERY     tendon surgery left little finger  . ORIF HUMERUS FRACTURE Right 11/17/2014   Procedure: OPEN REDUCTION INTERNAL FIXATION (ORIF) PROXIMAL HUMERUS FRACTURE;  Surgeon: Jones Broom, MD;  Location: [facility] OR;  Service: Orthopedics;  Laterality: Right;  Open reduction internal fixation proximal humerus fracture right shoulder  . TONSILLECTOMY      There were no vitals filed for this visit.      Subjective Assessment - 12/16/16 1404    Subjective Pt has been completed her exercises daily, when she remembers them. She has not performed prone exercise - too difficult. "I feel a little more loose".     Currently in Pain? Yes   Pain Score 8    Pain Location Thoracic   Pain Orientation Right;Left   Pain Descriptors / Indicators Tightness   Aggravating Factors  over doing activity   Pain  Relieving Factors prayer, heat, OTC meds            [facility] PT Assessment - 12/16/16 0001      Assessment   Medical Diagnosis cervical and lumbar DDD   Referring Provider Dr. Benjamin Stain   Onset Date/Surgical Date 03/11/16   Next MD Visit PRN         [facility] Adult PT Treatment/Exercise - 12/16/16 0001      Lumbar Exercises: Stretches   Passive Hamstring Stretch 2 reps;30 seconds     Lumbar Exercises: Supine   Other Supine Lumbar Exercises MEEKs postural realignment program:  head press with chin tuck x 5 sec x 8 reps; leg press x 5 reps, 8 reps each side; arm/leg lengthener x 20 sec x 4 reps each side (some discomfort reported when performed on Lt side)   Other Supine Lumbar Exercises alternate shoulder flexion x 10; snow angels to tolerance x 10 slow reps     Moist Heat Therapy   Number Minutes Moist Heat 20 Minutes   Moist Heat Location Lumbar Spine  thoracic     Electrical Stimulation   Electrical Stimulation Location Rt thoracic to lumbar paraspinals   Electrical Stimulation Action IFC   Electrical Stimulation Parameters to tolerance   Electrical Stimulation Goals Pain  PT Education - 12/16/16 1416    Education provided Yes   Education Details HEP -    Person(s) Educated Patient   Methods Explanation;Handout;Verbal cues;Tactile cues;Demonstration   Comprehension Verbalized understanding;Returned demonstration          PT Short Term Goals - 12/16/16 1708      PT SHORT TERM GOAL #1   Title I with initial HEP for postural realignment ( 01/21/17)    Time 4   Period Weeks   Status On-going     PT SHORT TERM GOAL #2   Title report =/> 25% reduction in back pain ( 01/21/17)    Time 4   Period Weeks   Status On-going     PT SHORT TERM GOAL #3   Title improve FOTO =/< 55% limited ( 01/21/17)    Time 4   Period Weeks   Status On-going     PT SHORT TERM GOAL #4   Title improve REEDCO posture score =/> 65/100 ( 01/21/17)    Time 4    Period Weeks   Status On-going           PT Long Term Goals - 12/16/16 1438      PT LONG TERM GOAL #1   Title I with advanced HEP to include gentle resistance with bands and body weight ( 02/04/17)    Time 8   Period Weeks   Status On-going     PT LONG TERM GOAL #2   Title report =/> 50% improvement in back pain to allow her to perform household chores easier ( 02/04/17)    Time 8   Period Weeks   Status On-going     PT LONG TERM GOAL #3   Title improve REEDCO posture score =/> 80/100 to allow for upright posture with walking ( 02/04/17)    Time 8   Period Weeks   Status On-going     PT LONG TERM GOAL #4   Title improve FOTO =/< 48% limited, CK level ( 02/04/17)    Time 8   Period Weeks   Status On-going     PT LONG TERM GOAL #5   Title demo bilat hip abduction =/> 5-/5 ( 02/04/17)    Time 8   Period Weeks   Status On-going               Plan - 12/16/16 1439    Clinical Impression Statement Pt continues with pain in midback to hip.  She has learned stretches have loosened her up.  she tolerated all exercises today with min increase in pain. Pt given info on TENs as she may buy unit for home.  Pt making gradual progress towards goals.    Rehab Potential Excellent   PT Frequency 2x / week   PT Duration 8 weeks   PT Treatment/Interventions Moist Heat;Ultrasound;Therapeutic exercise;Dry needling;Manual techniques;Neuromuscular re-education;Cryotherapy;Electrical Stimulation;Patient/family education;Passive range of motion   PT Next Visit Plan Add hip abduction and  supine scap stabilizing exercise to HEP, if tolerated.    Consulted and Agree with Plan of Care Patient      Patient will benefit from skilled therapeutic intervention in order to improve the following deficits and impairments:  Difficulty walking, Increased muscle spasms, Pain, Improper body mechanics, Postural dysfunction, Decreased strength  Visit Diagnosis: Abnormal posture  Muscle weakness  (generalized)  Other symptoms and signs involving the musculoskeletal system     Problem List Patient Active Problem List   Diagnosis Date Noted  . S/P  ORIF (open reduction internal fixation) right proximal humeral fracture 11/17/2014  . Fracture of humerus, proximal, right, closed 11/16/2014  . Closed fracture of humerus, upper end 11/09/2014  . Osteoporosis 04/21/2014  . Facet arthritis of lumbar region (HCC) 04/12/2014  . Scoliosis (and kyphoscoliosis), idiopathic 03/28/2014  . Left cervical radiculopathy 03/28/2014  . Fibromyalgia 03/28/2014  . Foot pain 02/09/2013  . Intracranial subdural hematoma (HCC) 01/06/2013  . Absolute anemia 01/06/2013  . Narcotic drug use 10/15/2012  . Impaired renal function 07/16/2012  . DDD (degenerative disc disease), cervical 07/16/2012  . Gout 06/15/2012  . HLD (hyperlipidemia) 01/02/2012  . Carotid arterial disease (HCC) 01/02/2012  . Anxiety 01/02/2012  . Carotid artery disease (HCC) 01/02/2012  . Scoliosis 01/02/2012  . Calcification of coronary artery 11/02/2011  . Diabetes mellitus, type 2 (HCC) 08/30/2011  . Polypharmacy 08/30/2011  . Abdominal skin ptosis 08/30/2011  . Type 2 diabetes mellitus (HCC) 08/30/2011  . Blurred vision 01/31/2011  . VFD (visual field defect) 01/31/2011  . Cannot sleep 01/09/2011  . Essential (primary) hypertension 12/09/2007  . Fibrositis 12/09/2007  . Avitaminosis D 04/10/2007   Mayer Camel, PTA 12/16/16 5:09 PM  [facility] [place] [address] [place], [place], [address] Phone: [phone]   Fax:  [phone]  Name: Kristina Shea MRN: [id number] Date of Birth: [birth year]

## 2016-12-16 NOTE — Patient Instructions (Addendum)
[place]: Double Arm    Arms near sides, palms up. Press both arms lightly into floor, slide arms out to side and up alongside head. Keep contact with floor throughout motion. At maximal position, lengthen arms. Hold ___ seconds. Relax. Slide arms back to start. Repeat ___ times.  Copyright  VHI. All rights reserved.

## 2016-12-19 ENCOUNTER — Ambulatory Visit (INDEPENDENT_AMBULATORY_CARE_PROVIDER_SITE_OTHER): Payer: Medicare Other | Admitting: Physical Therapy

## 2016-12-19 ENCOUNTER — Encounter: Payer: Self-pay | Admitting: Physical Therapy

## 2016-12-19 DIAGNOSIS — R29898 Other symptoms and signs involving the musculoskeletal system: Secondary | ICD-10-CM | POA: Diagnosis not present

## 2016-12-19 DIAGNOSIS — R293 Abnormal posture: Secondary | ICD-10-CM

## 2016-12-19 DIAGNOSIS — M6281 Muscle weakness (generalized): Secondary | ICD-10-CM

## 2016-12-19 NOTE — Therapy (Signed)
[facility] [place] [address] [place], [place], [address] Phone: [phone]   Fax:  [phone]  Physical Therapy Treatment  Patient Details  Name: Kristina Shea MRN: [id number] Date of Birth: Aug 29, 1945 Referring Provider: Dr. Benjamin Stain  Encounter Date: 12/19/2016      PT End of Session - 12/19/16 1102    Visit Number 4   Number of Visits 16   Date for PT Re-Evaluation 02/04/17   PT Start Time 1103   PT Stop Time 1200   PT Time Calculation (min) 57 min   Activity Tolerance Patient tolerated treatment well      Past Medical History:  Diagnosis Date  . Arthritis   . Diabetes mellitus without complication (HCC)    type 2  . Fibromyalgia   . History of hiatal hernia   . Hypertension   . IBS (irritable bowel syndrome)   . Pneumonia   . Sleep apnea    many years ago, does not use cpap    Past Surgical History:  Procedure Laterality Date  . ABDOMINAL HYSTERECTOMY    . COLONOSCOPY    . EYE SURGERY Bilateral    cataract surgery with lens implant  . FRACTURE SURGERY     tendon surgery left little finger  . ORIF HUMERUS FRACTURE Right 11/17/2014   Procedure: OPEN REDUCTION INTERNAL FIXATION (ORIF) PROXIMAL HUMERUS FRACTURE;  Surgeon: Jones Broom, MD;  Location: [facility] OR;  Service: Orthopedics;  Laterality: Right;  Open reduction internal fixation proximal humerus fracture right shoulder  . TONSILLECTOMY      There were no vitals filed for this visit.      Subjective Assessment - 12/19/16 1103    Subjective Pt reports she has generalized pain all over today due to the weather and her fibromyalgia.                          [facility] Adult PT Treatment/Exercise - 12/19/16 0001      Lumbar Exercises: Standing   Other Standing Lumbar Exercises 15 reps trunk slides ribs to the left.       Lumbar Exercises: Supine   Bridge 20 reps   Other Supine Lumbar Exercises 10 reps each, yellow band,  overhead pull, horizontal abduction, ER, SASH   Other Supine Lumbar Exercises thoracic lifts x 10 reps     Modalities   Modalities Electrical Stimulation;Moist Heat                  PT Short Term Goals - 12/16/16 1708      PT SHORT TERM GOAL #1   Title I with initial HEP for postural realignment ( 01/21/17)    Time 4   Period Weeks   Status On-going     PT SHORT TERM GOAL #2   Title report =/> 25% reduction in back pain ( 01/21/17)    Time 4   Period Weeks   Status On-going     PT SHORT TERM GOAL #3   Title improve FOTO =/< 55% limited ( 01/21/17)    Time 4   Period Weeks   Status On-going     PT SHORT TERM GOAL #4   Title improve REEDCO posture score =/> 65/100 ( 01/21/17)    Time 4   Period Weeks   Status On-going           PT Long Term Goals - 12/19/16 1133      PT LONG TERM GOAL #  1   Title I with advanced HEP to include gentle resistance with bands and body weight ( 02/04/17)    Status On-going     PT LONG TERM GOAL #2   Title report =/> 50% improvement in back pain to allow her to perform household chores easier ( 02/04/17)    Status On-going     PT LONG TERM GOAL #3   Title improve REEDCO posture score =/> 80/100 to allow for upright posture with walking ( 02/04/17)    Status On-going     PT LONG TERM GOAL #4   Title improve FOTO =/< 48% limited, CK level ( 02/04/17)    Status On-going     PT LONG TERM GOAL #5   Title demo bilat hip abduction =/> 5-/5 ( 02/04/17)    Status On-going               Plan - 12/19/16 1150    Clinical Impression Statement Kristina Shea reported decreased back pain at the end of todays session. She tolerated the next level of HEP without any difficulties.  Did require some cueing for form.    Rehab Potential Excellent   PT Frequency 2x / week   PT Duration 8 weeks   PT Treatment/Interventions Moist Heat;Ultrasound;Therapeutic exercise;Dry needling;Manual techniques;Neuromuscular  re-education;Cryotherapy;Electrical Stimulation;Patient/family education;Passive range of motion   PT Next Visit Plan assess tolerance to new HEP, progress MEEKs back of the body strengthening.    Consulted and Agree with Plan of Care Patient      Patient will benefit from skilled therapeutic intervention in order to improve the following deficits and impairments:  Difficulty walking, Increased muscle spasms, Pain, Improper body mechanics, Postural dysfunction, Decreased strength  Visit Diagnosis: Abnormal posture  Muscle weakness (generalized)  Other symptoms and signs involving the musculoskeletal system     Problem List Patient Active Problem List   Diagnosis Date Noted  . S/P ORIF (open reduction internal fixation) right proximal humeral fracture 11/17/2014  . Fracture of humerus, proximal, right, closed 11/16/2014  . Closed fracture of humerus, upper end 11/09/2014  . Osteoporosis 04/21/2014  . Facet arthritis of lumbar region (HCC) 04/12/2014  . Scoliosis (and kyphoscoliosis), idiopathic 03/28/2014  . Left cervical radiculopathy 03/28/2014  . Fibromyalgia 03/28/2014  . Foot pain 02/09/2013  . Intracranial subdural hematoma (HCC) 01/06/2013  . Absolute anemia 01/06/2013  . Narcotic drug use 10/15/2012  . Impaired renal function 07/16/2012  . DDD (degenerative disc disease), cervical 07/16/2012  . Gout 06/15/2012  . HLD (hyperlipidemia) 01/02/2012  . Carotid arterial disease (HCC) 01/02/2012  . Anxiety 01/02/2012  . Carotid artery disease (HCC) 01/02/2012  . Scoliosis 01/02/2012  . Calcification of coronary artery 11/02/2011  . Diabetes mellitus, type 2 (HCC) 08/30/2011  . Polypharmacy 08/30/2011  . Abdominal skin ptosis 08/30/2011  . Type 2 diabetes mellitus (HCC) 08/30/2011  . Blurred vision 01/31/2011  . VFD (visual field defect) 01/31/2011  . Cannot sleep 01/09/2011  . Essential (primary) hypertension 12/09/2007  . Fibrositis 12/09/2007  . Avitaminosis D  04/10/2007    Roderic Scarce PT  12/19/2016, 11:53 AM  [facility] [address] [place], [place], [address] Phone: [phone]   Fax:  [phone]  Name: Kristina Shea MRN: [id number] Date of Birth: 1946/03/10

## 2016-12-19 NOTE — Patient Instructions (Addendum)
Thoracolumbar Side-Bend: Manual (Standing)    Left hand on hip, other hand at rib level, gently press hands together, shifting ribs to left . Hold _5-10___ seconds. Relax. Repeat __10-20__ times per set. Do _1___ sets per session. Do __everyother day__ sessions per day.  Over Head Pull: Narrow Grip       On back, knees bent, feet flat, band across thighs, elbows straight but relaxed. Pull hands apart (start). Keeping elbows straight, bring arms up and over head, hands toward floor. Keep pull steady on band. Hold momentarily. Return slowly, keeping pull steady, back to start. Repeat _10-20__ times. Band color ___yellow__.  Perform every other day.   Side Pull: Double Arm   On back, knees bent, feet flat. Arms perpendicular to body, shoulder level, elbows straight but relaxed. Pull arms out to sides, elbows straight. Resistance band comes across collarbones, hands toward floor. Hold momentarily, squeeze shoulder blades together. Slowly return to starting position. Repeat _10-20__ times. Band color __yellow__.  Perform every other day.   Sash   On back, knees bent, feet flat, left hand on left hip, right hand above left. Pull right arm DIAGONALLY (hip to shoulder) across chest. Bring right arm along head toward floor. Hold momentarily. Slowly return to starting position. Repeat _10-20__ times. Do with left arm. Band color ___yellow__. Perform every other day.   Shoulder Rotation: Double Arm   On back, knees bent, feet flat, elbows tucked at sides, bent 90, hands palms up. Pull hands apart and down toward floor, keeping elbows near sides. Hold momentarily. Slowly return to starting position. Repeat _10-20__ times. Band color __yellow___.  Perform every other day.    Bridge    Lie back, legs bent. Inhale, pressing hips up. Keeping ribs in, lengthen lower back. Exhale, rolling down along spine from top. Repeat __10-20__ times. Do every other day.

## 2016-12-24 ENCOUNTER — Encounter: Payer: Self-pay | Admitting: Physical Therapy

## 2016-12-24 ENCOUNTER — Ambulatory Visit (INDEPENDENT_AMBULATORY_CARE_PROVIDER_SITE_OTHER): Payer: Medicare Other | Admitting: Physical Therapy

## 2016-12-24 DIAGNOSIS — R29898 Other symptoms and signs involving the musculoskeletal system: Secondary | ICD-10-CM

## 2016-12-24 DIAGNOSIS — M6281 Muscle weakness (generalized): Secondary | ICD-10-CM | POA: Diagnosis not present

## 2016-12-24 DIAGNOSIS — R293 Abnormal posture: Secondary | ICD-10-CM | POA: Diagnosis present

## 2016-12-24 NOTE — Therapy (Signed)
[facility] [place] [address] [place], [place], [address] Phone: [phone]   Fax:  [phone]  Physical Therapy Treatment  Patient Details  Name: Kristina Shea MRN: [id number] Date of Birth: 1946-01-16 Referring Provider: Dr. Benjamin Stain  Encounter Date: 12/24/2016      PT End of Session - 12/24/16 1031    Visit Number 5   Number of Visits 16   Date for PT Re-Evaluation 02/04/17   PT Start Time 1028  in late   PT Stop Time 1108   PT Time Calculation (min) 40 min   Activity Tolerance Patient tolerated treatment well      Past Medical History:  Diagnosis Date  . Arthritis   . Diabetes mellitus without complication (HCC)    type 2  . Fibromyalgia   . History of hiatal hernia   . Hypertension   . IBS (irritable bowel syndrome)   . Pneumonia   . Sleep apnea    many years ago, does not use cpap    Past Surgical History:  Procedure Laterality Date  . ABDOMINAL HYSTERECTOMY    . COLONOSCOPY    . EYE SURGERY Bilateral    cataract surgery with lens implant  . FRACTURE SURGERY     tendon surgery left little finger  . ORIF HUMERUS FRACTURE Right 11/17/2014   Procedure: OPEN REDUCTION INTERNAL FIXATION (ORIF) PROXIMAL HUMERUS FRACTURE;  Surgeon: Jones Broom, MD;  Location: [facility] OR;  Service: Orthopedics;  Laterality: Right;  Open reduction internal fixation proximal humerus fracture right shoulder  . TONSILLECTOMY      There were no vitals filed for this visit.      Subjective Assessment - 12/24/16 1030    Subjective Pt reports she is sorry she late today.  She reports her overall pain is decreasing, feels like the exercise is helping.    Patient Stated Goals walk better, and perform house activites easier.    Currently in Pain? Yes   Pain Score 5    Pain Location Thoracic   Pain Orientation Right;Left   Pain Descriptors / Indicators Tightness   Pain Type Chronic pain   Pain Onset More than a month  ago   Pain Frequency Constant   Aggravating Factors  doing to much   Pain Relieving Factors prayer, heat and exercise            [facility] PT Assessment - 12/24/16 0001      Assessment   Medical Diagnosis cervical and lumbar DDD     Posture/Postural Control   Posture Comments 5' 1 3/4" when measured.      Strength   Right Hip ABduction 4-/5   Left Hip ABduction 4/5                     [facility] Adult PT Treatment/Exercise - 12/24/16 0001      Lumbar Exercises: Aerobic   Stationary Bike Nustep L4x5'     Lumbar Exercises: Supine   Other Supine Lumbar Exercises on 1/2 half bolster, thoracic lifts, lat pull downs with 3#. marching, leg circles, opposite arm/leg sides.    Other Supine Lumbar Exercises 10 head presses into pillow     Lumbar Exercises: Sidelying   Clam 20 reps  regular and reverse, each side     Modalities   Modalities Pt declined modalities today                  PT Short Term Goals - 12/16/16 1708  PT SHORT TERM GOAL #1   Title I with initial HEP for postural realignment ( 01/21/17)    Time 4   Period Weeks   Status On-going     PT SHORT TERM GOAL #2   Title report =/> 25% reduction in back pain ( 01/21/17)    Time 4   Period Weeks   Status On-going     PT SHORT TERM GOAL #3   Title improve FOTO =/< 55% limited ( 01/21/17)    Time 4   Period Weeks   Status On-going     PT SHORT TERM GOAL #4   Title improve REEDCO posture score =/> 65/100 ( 01/21/17)    Time 4   Period Weeks   Status On-going           PT Long Term Goals - 12/24/16 1031      PT LONG TERM GOAL #1   Title I with advanced HEP to include gentle resistance with bands and body weight ( 02/04/17)    Status On-going     PT LONG TERM GOAL #2   Title report =/> 50% improvement in back pain to allow her to perform household chores easier ( 02/04/17)    Status On-going  pt reports she is able to do more before she has to take a break.      PT LONG  TERM GOAL #3   Title improve REEDCO posture score =/> 80/100 to allow for upright posture with walking ( 02/04/17)    Status On-going     PT LONG TERM GOAL #4   Title improve FOTO =/< 48% limited, CK level ( 02/04/17)    Status On-going     PT LONG TERM GOAL #5   Title demo bilat hip abduction =/> 5-/5 ( 02/04/17)    Status On-going               Plan - 12/24/16 1052    Clinical Impression Statement Olegario Messier is making progress to her goals.  Still has significant posturall changes in standing, tightness and weakness through her trunk.  She is working on standing tall.    Rehab Potential Excellent   PT Frequency 2x / week   PT Duration 8 weeks   PT Treatment/Interventions Moist Heat;Ultrasound;Therapeutic exercise;Dry needling;Manual techniques;Neuromuscular re-education;Cryotherapy;Electrical Stimulation;Patient/family education;Passive range of motion   PT Next Visit Plan perform back of the body strengthening exercise, modalties for pain   Consulted and Agree with Plan of Care Patient      Patient will benefit from skilled therapeutic intervention in order to improve the following deficits and impairments:  Difficulty walking, Increased muscle spasms, Pain, Improper body mechanics, Postural dysfunction, Decreased strength  Visit Diagnosis: Abnormal posture  Muscle weakness (generalized)  Other symptoms and signs involving the musculoskeletal system     Problem List Patient Active Problem List   Diagnosis Date Noted  . S/P ORIF (open reduction internal fixation) right proximal humeral fracture 11/17/2014  . Fracture of humerus, proximal, right, closed 11/16/2014  . Closed fracture of humerus, upper end 11/09/2014  . Osteoporosis 04/21/2014  . Facet arthritis of lumbar region (HCC) 04/12/2014  . Scoliosis (and kyphoscoliosis), idiopathic 03/28/2014  . Left cervical radiculopathy 03/28/2014  . Fibromyalgia 03/28/2014  . Foot pain 02/09/2013  . Intracranial subdural  hematoma (HCC) 01/06/2013  . Absolute anemia 01/06/2013  . Narcotic drug use 10/15/2012  . Impaired renal function 07/16/2012  . DDD (degenerative disc disease), cervical 07/16/2012  . Gout 06/15/2012  . HLD (  hyperlipidemia) 01/02/2012  . Carotid arterial disease (HCC) 01/02/2012  . Anxiety 01/02/2012  . Carotid artery disease (HCC) 01/02/2012  . Scoliosis 01/02/2012  . Calcification of coronary artery 11/02/2011  . Diabetes mellitus, type 2 (HCC) 08/30/2011  . Polypharmacy 08/30/2011  . Abdominal skin ptosis 08/30/2011  . Type 2 diabetes mellitus (HCC) 08/30/2011  . Blurred vision 01/31/2011  . VFD (visual field defect) 01/31/2011  . Cannot sleep 01/09/2011  . Essential (primary) hypertension 12/09/2007  . Fibrositis 12/09/2007  . Avitaminosis D 04/10/2007    Roderic Scarce PT  12/24/2016, 1:12 PM  [facility] [address] [place], [place], [address] Phone: [phone]   Fax:  [phone]  Name: Caelie Remsburg MRN: [id number] Date of Birth: 1945-11-03

## 2016-12-26 ENCOUNTER — Encounter: Payer: Medicare Other | Admitting: Physical Therapy

## 2016-12-30 ENCOUNTER — Encounter: Payer: Self-pay | Admitting: Rehabilitative and Restorative Service Providers"

## 2016-12-30 ENCOUNTER — Ambulatory Visit (INDEPENDENT_AMBULATORY_CARE_PROVIDER_SITE_OTHER): Payer: Medicare Other | Admitting: Rehabilitative and Restorative Service Providers"

## 2016-12-30 DIAGNOSIS — M6281 Muscle weakness (generalized): Secondary | ICD-10-CM

## 2016-12-30 DIAGNOSIS — R29898 Other symptoms and signs involving the musculoskeletal system: Secondary | ICD-10-CM | POA: Diagnosis not present

## 2016-12-30 DIAGNOSIS — R293 Abnormal posture: Secondary | ICD-10-CM | POA: Diagnosis not present

## 2016-12-30 NOTE — Patient Instructions (Addendum)
Strengthening: Hip Abductor - Resisted    With band looped around both legs above knees, push thighs apart. Repeat _10___ times per set. Do __1-2__ sets per session. Do __1__ sessions per day. Repeat holding one leg still as you move the the opposite knee out to the side.  Repeat 10 times. 1-2 sets x 1 time/day   Abduction: Clam (Eccentric) - Side-Lying    Lie on side with knees bent. Lift top knee, keeping feet together. Keep trunk steady. Slowly lower for 3-5 seconds. __10_ reps per set, _1-2__ sets per day, __1 time/day   Resisted Horizontal Abduction: Bilateral lying on back     Lying on back, tubing in both hands, arms out in front. Keeping arms straight, pinch shoulder blades together and stretch arms out to side. Pause. Repeat __10__ times per set. Do __1-2__ sets per session. Do ___1_ sessions per day.    Scapular Retraction: Elbow Flexion (Standing)    With elbows bent to 90, pinch shoulder blades together and rotate arms out, keeping elbows bent. Repeat __10__ times per set. Do __1-2__ sets per session. Do _several___ sessions per day.    Resisted External Rotation: in Neutral - Bilateral    Sit or stand, tubing in both hands, elbows at sides, bent to 90, forearms forward. Pinch shoulder blades down and back and rotate forearms out. Keep elbows at sides. Forearms level. Repeat __10__ times per set. Do __1-2__ sets per session. Do __1-2__ sessions per day.

## 2016-12-30 NOTE — Therapy (Signed)
[facility] [place] [address] [place], [place], [address] Phone: [phone]   Fax:  [phone]  Physical Therapy Treatment  Patient Details  Name: Chala Gul MRN: [id number] Date of Birth: 1945-11-20 Referring Provider: Dr Benjamin Stain  Encounter Date: 12/30/2016      PT End of Session - 12/30/16 1151    Visit Number 6   Number of Visits 16   Date for PT Re-Evaluation 02/04/17   PT Start Time 1149   PT Stop Time 1244   PT Time Calculation (min) 55 min   Activity Tolerance Patient tolerated treatment well      Past Medical History:  Diagnosis Date  . Arthritis   . Diabetes mellitus without complication (HCC)    type 2  . Fibromyalgia   . History of hiatal hernia   . Hypertension   . IBS (irritable bowel syndrome)   . Pneumonia   . Sleep apnea    many years ago, does not use cpap    Past Surgical History:  Procedure Laterality Date  . ABDOMINAL HYSTERECTOMY    . COLONOSCOPY    . EYE SURGERY Bilateral    cataract surgery with lens implant  . FRACTURE SURGERY     tendon surgery left little finger  . ORIF HUMERUS FRACTURE Right 11/17/2014   Procedure: OPEN REDUCTION INTERNAL FIXATION (ORIF) PROXIMAL HUMERUS FRACTURE;  Surgeon: Jones Broom, MD;  Location: [facility] OR;  Service: Orthopedics;  Laterality: Right;  Open reduction internal fixation proximal humerus fracture right shoulder  . TONSILLECTOMY      There were no vitals filed for this visit.      Subjective Assessment - 12/30/16 1154    Subjective She feels better. Thinks she had a virus which lasted a couple of days. She is feeling better now. She has done some of her exercises when she remembers the exercises. She has some trouble with HEP because the bed is a lot softer than the table here and it is too difficult to get in and out of the floor.    Currently in Pain? No/denies  just ignores the pain - would have pain if she had to climb stairs              [facility] PT Assessment - 12/30/16 0001      Assessment   Medical Diagnosis cervical and lumbar DDD   Referring Provider Dr Benjamin Stain   Onset Date/Surgical Date 03/11/16   Next MD Visit PRN   Prior Therapy none     Palpation   Palpation comment palpable tightness through Lt lats/QL/transverse abdominals; Rt thoracolumbar paraspinal musculature                      [facility] Adult PT Treatment/Exercise - 12/30/16 0001      Lumbar Exercises: Aerobic   Stationary Bike Nustep L5 x 6'     Lumbar Exercises: Seated   Sit to Stand Limitations sitting trunk extension working on posture; W's AROM; L's yellow TB      Lumbar Exercises: Supine   Clam 10 reps  both legs out - repeat moving one leg holding opp still x10    Bridge 10 reps   Other Supine Lumbar Exercises 10 head presses into pillow; pressing LE into table x 10 each LE; horizontal abd; SASH; ER with yellow TB x 10      Lumbar Exercises: Sidelying   Clam 10 reps   Other Sidelying Lumbar Exercises stretching over folded  pillow for stretch toward Rt for prolonged stretch 1-2 min with PT manual work through the lumbar spine myofacial assist for stretch      Moist Heat Therapy   Number Minutes Moist Heat 15 Minutes   Moist Heat Location Lumbar Spine  thoracic spine      Electrical Stimulation   Electrical Stimulation Location Rt thoracic to lumbar paraspinals   Electrical Stimulation Action IFC   Electrical Stimulation Parameters to tolerance   Electrical Stimulation Goals Pain;Tone                PT Education - 12/30/16 1221    Education provided Yes   Education Details HEP    Person(s) Educated Patient   Methods Explanation;Demonstration;Tactile cues;Verbal cues;Handout   Comprehension Verbalized understanding;Returned demonstration;Verbal cues required;Tactile cues required          PT Short Term Goals - 12/30/16 1244      PT SHORT TERM GOAL #1   Title I with initial HEP for  postural realignment ( 01/21/17)    Time 4   Period Weeks   Status On-going     PT SHORT TERM GOAL #2   Title report =/> 25% reduction in back pain ( 01/21/17)    Time 4   Period Weeks   Status On-going     PT SHORT TERM GOAL #3   Title improve FOTO =/< 55% limited ( 01/21/17)    Time 4   Period Weeks   Status On-going     PT SHORT TERM GOAL #4   Title improve REEDCO posture score =/> 65/100 ( 01/21/17)    Time 4           PT Long Term Goals - 12/30/16 1245      PT LONG TERM GOAL #1   Title I with advanced HEP to include gentle resistance with bands and body weight ( 02/04/17)    Time 8   Period Weeks   Status On-going     PT LONG TERM GOAL #2   Title report =/> 50% improvement in back pain to allow her to perform household chores easier ( 02/04/17)    Time 8   Period Weeks   Status On-going     PT LONG TERM GOAL #3   Title improve REEDCO posture score =/> 80/100 to allow for upright posture with walking ( 02/04/17)    Time 8   Period Weeks   Status On-going     PT LONG TERM GOAL #4   Title improve FOTO =/< 48% limited, CK level ( 02/04/17)    Time 8   Period Weeks   Status On-going     PT LONG TERM GOAL #5   Title demo bilat hip abduction =/> 5-/5 ( 02/04/17)    Time 8   Period Weeks   Status On-going               Plan - 12/30/16 1157    Clinical Impression Statement Tolerated exercise without difficulty. Added sitting exercises without increase in pain. Continues to work on posture and alignment. Progressing gradually toward goals of therapy.    Rehab Potential Excellent   PT Frequency 2x / week   PT Duration 8 weeks   PT Treatment/Interventions Moist Heat;Ultrasound;Therapeutic exercise;Dry needling;Manual techniques;Neuromuscular re-education;Cryotherapy;Electrical Stimulation;Patient/family education;Passive range of motion   PT Next Visit Plan perform back of the body strengthening exercise, progressing as tolerated, modalties for pain  prn   Consulted and Agree with Plan of Care Patient  Patient will benefit from skilled therapeutic intervention in order to improve the following deficits and impairments:  Difficulty walking, Increased muscle spasms, Pain, Improper body mechanics, Postural dysfunction, Decreased strength  Visit Diagnosis: Abnormal posture  Muscle weakness (generalized)  Other symptoms and signs involving the musculoskeletal system     Problem List Patient Active Problem List   Diagnosis Date Noted  . S/P ORIF (open reduction internal fixation) right proximal humeral fracture 11/17/2014  . Fracture of humerus, proximal, right, closed 11/16/2014  . Closed fracture of humerus, upper end 11/09/2014  . Osteoporosis 04/21/2014  . Facet arthritis of lumbar region (HCC) 04/12/2014  . Scoliosis (and kyphoscoliosis), idiopathic 03/28/2014  . Left cervical radiculopathy 03/28/2014  . Fibromyalgia 03/28/2014  . Foot pain 02/09/2013  . Intracranial subdural hematoma (HCC) 01/06/2013  . Absolute anemia 01/06/2013  . Narcotic drug use 10/15/2012  . Impaired renal function 07/16/2012  . DDD (degenerative disc disease), cervical 07/16/2012  . Gout 06/15/2012  . HLD (hyperlipidemia) 01/02/2012  . Carotid arterial disease (HCC) 01/02/2012  . Anxiety 01/02/2012  . Carotid artery disease (HCC) 01/02/2012  . Scoliosis 01/02/2012  . Calcification of coronary artery 11/02/2011  . Diabetes mellitus, type 2 (HCC) 08/30/2011  . Polypharmacy 08/30/2011  . Abdominal skin ptosis 08/30/2011  . Type 2 diabetes mellitus (HCC) 08/30/2011  . Blurred vision 01/31/2011  . VFD (visual field defect) 01/31/2011  . Cannot sleep 01/09/2011  . Essential (primary) hypertension 12/09/2007  . Fibrositis 12/09/2007  . Avitaminosis D 04/10/2007    Sheron Robin Rober Minion PT, MPH  12/30/2016, 12:47 PM  [facility] [address] [place], [place], [address] Phone: [phone]    Fax:  [phone]  Name: Melyna Huron MRN: [id number] Date of Birth: [birth year]

## 2017-01-02 ENCOUNTER — Ambulatory Visit (INDEPENDENT_AMBULATORY_CARE_PROVIDER_SITE_OTHER): Payer: Medicare Other | Admitting: Rehabilitative and Restorative Service Providers"

## 2017-01-02 ENCOUNTER — Encounter: Payer: Self-pay | Admitting: Rehabilitative and Restorative Service Providers"

## 2017-01-02 DIAGNOSIS — M6281 Muscle weakness (generalized): Secondary | ICD-10-CM

## 2017-01-02 DIAGNOSIS — R293 Abnormal posture: Secondary | ICD-10-CM

## 2017-01-02 DIAGNOSIS — R29898 Other symptoms and signs involving the musculoskeletal system: Secondary | ICD-10-CM

## 2017-01-02 NOTE — Therapy (Signed)
[facility] [place] [address] [place], [place], [address] Phone: [phone]   Fax:  [phone]  Physical Therapy Treatment  Patient Details  Name: Kristina Shea MRN: [id number] Date of Birth: 23-Jul-1945 Referring Provider: Dr Benjamin Stain  Encounter Date: 01/02/2017      PT End of Session - 01/02/17 1205    Visit Number 7   Number of Visits 16   Date for PT Re-Evaluation 02/04/17   PT Start Time 1147   PT Stop Time 1243   PT Time Calculation (min) 56 min   Activity Tolerance Patient tolerated treatment well      Past Medical History:  Diagnosis Date  . Arthritis   . Diabetes mellitus without complication (HCC)    type 2  . Fibromyalgia   . History of hiatal hernia   . Hypertension   . IBS (irritable bowel syndrome)   . Pneumonia   . Sleep apnea    many years ago, does not use cpap    Past Surgical History:  Procedure Laterality Date  . ABDOMINAL HYSTERECTOMY    . COLONOSCOPY    . EYE SURGERY Bilateral    cataract surgery with lens implant  . FRACTURE SURGERY     tendon surgery left little finger  . ORIF HUMERUS FRACTURE Right 11/17/2014   Procedure: OPEN REDUCTION INTERNAL FIXATION (ORIF) PROXIMAL HUMERUS FRACTURE;  Surgeon: Jones Broom, MD;  Location: [facility] OR;  Service: Orthopedics;  Laterality: Right;  Open reduction internal fixation proximal humerus fracture right shoulder  . TONSILLECTOMY      There were no vitals filed for this visit.      Subjective Assessment - 01/02/17 1206    Subjective Therapy is helping. She feels better when she finishes therapy. She is working on the exercises at home and working on her posture. Some increased pain today which she feels is due to the pressure system (rain).    Currently in Pain? Yes   Pain Score 6    Pain Location Thoracic   Pain Orientation Right;Left   Pain Descriptors / Indicators Tightness   Pain Type Chronic pain   Pain Onset More than a  month ago   Pain Frequency Constant                         [facility] Adult PT Treatment/Exercise - 01/02/17 0001      Neuro Re-ed    Neuro Re-ed Details  working on standing posture and alignment with verbal and tactile cues for postural correction - shifting hips to Rt, shoulders midline, relaxing Rt UE, head to midline, core engaged, standing tall - working with and without mirror      Lumbar Exercises: Seated   Sit to Stand Limitations sitting trunk extension working on posture; W's AROM; L's yellow TB      Lumbar Exercises: Supine   Other Supine Lumbar Exercises head press into pillow; leg lengthener x 10 each      Lumbar Exercises: Sidelying   Other Sidelying Lumbar Exercises stretching over folded pillow for stretch toward Rt for prolonged stretch 1-2 min with PT manual work through the lumbar spine myofacial assist for stretch      Moist Heat Therapy   Number Minutes Moist Heat 20 Minutes   Moist Heat Location Lumbar Spine  thoracic spine Rt lower ribs anteriorly     Electrical Stimulation   Electrical Stimulation Location Rt thoracic to lumbar paraspinals   Electrical Stimulation Action  IFC   Electrical Stimulation Parameters to tolerance   Electrical Stimulation Goals Pain;Tone     Manual Therapy   Manual therapy comments patient supine    Soft tissue mobilization deep tissue worrk through the rectus; transverse abdominals; lats lateral trunk fascial restirctions bilaterally   audible pop Rt lower ribs with deep tissue release - no pain   Myofascial Release lateral trunk lower ribs to crest of pelvis                   PT Short Term Goals - 12/30/16 1244      PT SHORT TERM GOAL #1   Title I with initial HEP for postural realignment ( 01/21/17)    Time 4   Period Weeks   Status On-going     PT SHORT TERM GOAL #2   Title report =/> 25% reduction in back pain ( 01/21/17)    Time 4   Period Weeks   Status On-going     PT SHORT TERM GOAL  #3   Title improve FOTO =/< 55% limited ( 01/21/17)    Time 4   Period Weeks   Status On-going     PT SHORT TERM GOAL #4   Title improve REEDCO posture score =/> 65/100 ( 01/21/17)    Time 4           PT Long Term Goals - 12/30/16 1245      PT LONG TERM GOAL #1   Title I with advanced HEP to include gentle resistance with bands and body weight ( 02/04/17)    Time 8   Period Weeks   Status On-going     PT LONG TERM GOAL #2   Title report =/> 50% improvement in back pain to allow her to perform household chores easier ( 02/04/17)    Time 8   Period Weeks   Status On-going     PT LONG TERM GOAL #3   Title improve REEDCO posture score =/> 80/100 to allow for upright posture with walking ( 02/04/17)    Time 8   Period Weeks   Status On-going     PT LONG TERM GOAL #4   Title improve FOTO =/< 48% limited, CK level ( 02/04/17)    Time 8   Period Weeks   Status On-going     PT LONG TERM GOAL #5   Title demo bilat hip abduction =/> 5-/5 ( 02/04/17)    Time 8   Period Weeks   Status On-going               Plan - 01/02/17 1208    Clinical Impression Statement Good correction of alignment with work in standing - assist by PT and then independently. Used mirror to reinforce postural corrections. Good response to manual work and modalities. Improved posture and alignment following treatment.    Rehab Potential Excellent   PT Frequency 2x / week   PT Duration 8 weeks   PT Treatment/Interventions Moist Heat;Ultrasound;Therapeutic exercise;Dry needling;Manual techniques;Neuromuscular re-education;Cryotherapy;Electrical Stimulation;Patient/family education;Passive range of motion   PT Next Visit Plan perform back of the body strengthening exercise, progressing as tolerated, assess response to manual work; modalties for pain prn   Consulted and Agree with Plan of Care Patient      Patient will benefit from skilled therapeutic intervention in order to improve the  following deficits and impairments:  Difficulty walking, Increased muscle spasms, Pain, Improper body mechanics, Postural dysfunction, Decreased strength  Visit Diagnosis: Abnormal posture  Muscle weakness (generalized)  Other symptoms and signs involving the musculoskeletal system     Problem List Patient Active Problem List   Diagnosis Date Noted  . S/P ORIF (open reduction internal fixation) right proximal humeral fracture 11/17/2014  . Fracture of humerus, proximal, right, closed 11/16/2014  . Closed fracture of humerus, upper end 11/09/2014  . Osteoporosis 04/21/2014  . Facet arthritis of lumbar region (HCC) 04/12/2014  . Scoliosis (and kyphoscoliosis), idiopathic 03/28/2014  . Left cervical radiculopathy 03/28/2014  . Fibromyalgia 03/28/2014  . Foot pain 02/09/2013  . Intracranial subdural hematoma (HCC) 01/06/2013  . Absolute anemia 01/06/2013  . Narcotic drug use 10/15/2012  . Impaired renal function 07/16/2012  . DDD (degenerative disc disease), cervical 07/16/2012  . Gout 06/15/2012  . HLD (hyperlipidemia) 01/02/2012  . Carotid arterial disease (HCC) 01/02/2012  . Anxiety 01/02/2012  . Carotid artery disease (HCC) 01/02/2012  . Scoliosis 01/02/2012  . Calcification of coronary artery 11/02/2011  . Diabetes mellitus, type 2 (HCC) 08/30/2011  . Polypharmacy 08/30/2011  . Abdominal skin ptosis 08/30/2011  . Type 2 diabetes mellitus (HCC) 08/30/2011  . Blurred vision 01/31/2011  . VFD (visual field defect) 01/31/2011  . Cannot sleep 01/09/2011  . Essential (primary) hypertension 12/09/2007  . Fibrositis 12/09/2007  . Avitaminosis D 04/10/2007    Tionna Gigante Rober Minion PT, MPH 01/02/2017, 12:47 PM  [facility] [address] [place], [place], [address] Phone: [phone]   Fax:  [phone]  Name: Kristina Shea MRN: [id number] Date of Birth: March 20, 1946

## 2017-01-06 ENCOUNTER — Ambulatory Visit (INDEPENDENT_AMBULATORY_CARE_PROVIDER_SITE_OTHER): Payer: Medicare Other | Admitting: Physical Therapy

## 2017-01-06 ENCOUNTER — Encounter: Payer: Self-pay | Admitting: Physical Therapy

## 2017-01-06 DIAGNOSIS — R29898 Other symptoms and signs involving the musculoskeletal system: Secondary | ICD-10-CM

## 2017-01-06 DIAGNOSIS — M6281 Muscle weakness (generalized): Secondary | ICD-10-CM

## 2017-01-06 DIAGNOSIS — R293 Abnormal posture: Secondary | ICD-10-CM | POA: Diagnosis not present

## 2017-01-06 NOTE — Therapy (Signed)
[place], [place], [address] Phone: [phone]   Fax:  [phone]  Physical Therapy Treatment  Patient Details  Name: Kristina Shea MRN: [id number] Date of Birth: 04/17/1945 Referring Provider: Dr Darene Lamer  Encounter Date: 01/06/2017      PT End of Session - 01/06/17 1147    Visit Number 8   Number of Visits 16   Date for PT Re-Evaluation 02/04/17   PT Start Time 1147   PT Stop Time 1252   PT Time Calculation (min) 65 min   Activity Tolerance Patient tolerated treatment well      Past Medical History:  Diagnosis Date  . Arthritis   . Diabetes mellitus without complication (Kristina Shea)    type 2  . Fibromyalgia   . History of hiatal hernia   . Hypertension   . IBS (irritable bowel syndrome)   . Pneumonia   . Sleep apnea    many years ago, does not use cpap    Past Surgical History:  Procedure Laterality Date  . ABDOMINAL HYSTERECTOMY    . COLONOSCOPY    . EYE SURGERY Bilateral    cataract surgery with lens implant  . FRACTURE SURGERY     tendon surgery left little finger  . ORIF HUMERUS FRACTURE Right 11/17/2014   Procedure: OPEN REDUCTION INTERNAL FIXATION (ORIF) PROXIMAL HUMERUS FRACTURE;  Surgeon: Tania Ade, MD;  Location: [facility];  Service: Orthopedics;  Laterality: Right;  Open reduction internal fixation proximal humerus fracture right shoulder  . TONSILLECTOMY      There were no vitals filed for this visit.      Subjective Assessment - 01/06/17 1152    Subjective [name] feels like she had a rib fx at her last visit, still has pain,  She has not seen an MD however states she has had fx ribs beofre and feels like that is what it is.  She states she didn't know that when the last therapist was trying to release her muslces that she was using her elbow.    Patient Stated Goals walk better, and perform house activites easier.    Currently in Pain? Yes   Pain Score 7    Pain  Location Rib cage   Pain Orientation Right   Pain Descriptors / Indicators Aching   Pain Type Acute pain   Pain Onset 1 to 4 weeks ago   Pain Frequency Constant   Aggravating Factors  driving, reach with Rt UE- vacuuming   Pain Relieving Factors ice, heat & stretches            [facility] PT Assessment - 01/06/17 0001      Assessment   Medical Diagnosis cervical and lumbar DDD   Referring Provider Dr T   Onset Date/Surgical Date 03/11/16     Posture/Postural Control   Posture Comments --                     [facility] Adult PT Treatment/Exercise - 01/06/17 0001      Lumbar Exercises: Aerobic   Stationary Bike Nustep L5 x 6'     Lumbar Exercises: Seated   Sit to Stand Limitations seated, deep breathing with overhead reach, then with side reach to the Lt VC for form and keeping upright posture.      Lumbar Exercises: Supine   Clam 20 reps  with red band, bilat, then 10 single leg   Other Supine Lumbar Exercises thoracic lifts with bridge,  hip shift to Rt and then lower down.    Other Supine Lumbar Exercises hooklying with lat pull down holding 1# wts, was going to hold more weight however had some Rt sided rib pain so used a lower wt. , increased to 2# for last set.      Modalities   Modalities Electrical Stimulation;Moist Heat     Moist Heat Therapy   Number Minutes Moist Heat 15 Minutes   Moist Heat Location Lumbar Spine;Cervical  thoracic     Electrical Stimulation   Electrical Stimulation Location thoracic area   Electrical Stimulation Action IFC   Electrical Stimulation Parameters to tolerance   Electrical Stimulation Goals Pain;Tone                  PT Short Term Goals - 12/30/16 1244      PT SHORT TERM GOAL #1   Title I with initial HEP for postural realignment ( 01/21/17)    Time 4   Period Weeks   Status On-going     PT SHORT TERM GOAL #2   Title report =/> 25% reduction in back pain ( 01/21/17)    Time 4   Period Weeks   Status  On-going     PT SHORT TERM GOAL #3   Title improve FOTO =/< 55% limited ( 01/21/17)    Time 4   Period Weeks   Status On-going     PT SHORT TERM GOAL #4   Title improve REEDCO posture score =/> 65/100 ( 01/21/17)    Time 4           PT Long Term Goals - 12/30/16 1245      PT LONG TERM GOAL #1   Title I with advanced HEP to include gentle resistance with bands and body weight ( 02/04/17)    Time 8   Period Weeks   Status On-going     PT LONG TERM GOAL #2   Title report =/> 50% improvement in back pain to allow her to perform household chores easier ( 02/04/17)    Time 8   Period Weeks   Status On-going     PT LONG TERM GOAL #3   Title improve REEDCO posture score =/> 80/100 to allow for upright posture with walking ( 02/04/17)    Time 8   Period Weeks   Status On-going     PT LONG TERM GOAL #4   Title improve FOTO =/< 48% limited, CK level ( 02/04/17)    Time 8   Period Weeks   Status On-going     PT LONG TERM GOAL #5   Title demo bilat hip abduction =/> 5-/5 ( 02/04/17)    Time 8   Period Weeks   Status On-going               Plan - 01/06/17 1240    Clinical Impression Statement Pt is very sore after the last session, she feels like she has a fractured rib from the manual work however has not seen a doctor as she said they won't do anything. No goals met at this time.  she is able to feel the work in her spine as the muslces around her scoliosis are worked.    Rehab Potential Excellent   PT Frequency 2x / week   PT Duration 8 weeks   PT Treatment/Interventions Moist Heat;Ultrasound;Therapeutic exercise;Dry needling;Manual techniques;Neuromuscular re-education;Cryotherapy;Electrical Stimulation;Patient/family education;Passive range of motion   PT Next Visit Plan spinal alignment exercise, back  of the body work. Assess how her ribs are feeling.    Consulted and Agree with Plan of Care Patient      Patient will benefit from skilled therapeutic  intervention in order to improve the following deficits and impairments:  Difficulty walking, Increased muscle spasms, Pain, Improper body mechanics, Postural dysfunction, Decreased strength  Visit Diagnosis: Abnormal posture  Muscle weakness (generalized)  Other symptoms and signs involving the musculoskeletal system     Problem List Patient Active Problem List   Diagnosis Date Noted  . S/P ORIF (open reduction internal fixation) right proximal humeral fracture 11/17/2014  . Fracture of humerus, proximal, right, closed 11/16/2014  . Closed fracture of humerus, upper end 11/09/2014  . Osteoporosis 04/21/2014  . Facet arthritis of lumbar region ([place]) 04/12/2014  . Scoliosis (and kyphoscoliosis), idiopathic 03/28/2014  . Left cervical radiculopathy 03/28/2014  . Fibromyalgia 03/28/2014  . Foot pain 02/09/2013  . Intracranial subdural hematoma ([place]) 01/06/2013  . Absolute anemia 01/06/2013  . Narcotic drug use 10/15/2012  . Impaired renal function 07/16/2012  . DDD (degenerative disc disease), cervical 07/16/2012  . Gout 06/15/2012  . HLD (hyperlipidemia) 01/02/2012  . Carotid arterial disease ([place]) 01/02/2012  . Anxiety 01/02/2012  . Carotid artery disease ([place]) 01/02/2012  . Scoliosis 01/02/2012  . Calcification of coronary artery 11/02/2011  . Diabetes mellitus, type 2 ([place]) 08/30/2011  . Polypharmacy 08/30/2011  . Abdominal skin ptosis 08/30/2011  . Type 2 diabetes mellitus ([place]) 08/30/2011  . Blurred vision 01/31/2011  . VFD (visual field defect) 01/31/2011  . Cannot sleep 01/09/2011  . Essential (primary) hypertension 12/09/2007  . Fibrositis 12/09/2007  . Avitaminosis D 04/10/2007    Manuela Schwartz Tayley Mudrick PT  01/06/2017, 12:45 PM  [facility] [place], [place], [address] Phone: [phone]   Fax:  [phone]  Name: Manahil Vanzile MRN: [id number] Date of Birth: 07/23/1945

## 2017-01-09 ENCOUNTER — Encounter: Payer: Medicare Other | Admitting: Physical Therapy

## 2017-01-15 ENCOUNTER — Encounter: Payer: Self-pay | Admitting: Physical Therapy

## 2017-01-15 ENCOUNTER — Ambulatory Visit (INDEPENDENT_AMBULATORY_CARE_PROVIDER_SITE_OTHER): Payer: Medicare Other | Admitting: Physical Therapy

## 2017-01-15 DIAGNOSIS — R293 Abnormal posture: Secondary | ICD-10-CM

## 2017-01-15 DIAGNOSIS — R29898 Other symptoms and signs involving the musculoskeletal system: Secondary | ICD-10-CM

## 2017-01-15 DIAGNOSIS — M6281 Muscle weakness (generalized): Secondary | ICD-10-CM | POA: Diagnosis not present

## 2017-01-15 NOTE — Therapy (Signed)
[place], [place], [address] Phone: [phone]   Fax:  [phone]  Physical Therapy Treatment  Patient Details  Name: Kristina Shea MRN: [id number] Date of Birth: Feb 11, 1946 Referring Provider: Dr Darene Lamer  Encounter Date: 01/15/2017      PT End of Session - 01/15/17 1105    Visit Number 9   Number of Visits 16   Date for PT Re-Evaluation 02/04/17   PT Start Time 1106   PT Stop Time 1211   PT Time Calculation (min) 65 min   Activity Tolerance Patient tolerated treatment well      Past Medical History:  Diagnosis Date  . Arthritis   . Diabetes mellitus without complication ([place])    type 2  . Fibromyalgia   . History of hiatal hernia   . Hypertension   . IBS (irritable bowel syndrome)   . Pneumonia   . Sleep apnea    many years ago, does not use cpap    Past Surgical History:  Procedure Laterality Date  . ABDOMINAL HYSTERECTOMY    . COLONOSCOPY    . EYE SURGERY Bilateral    cataract surgery with lens implant  . FRACTURE SURGERY     tendon surgery left little finger  . ORIF HUMERUS FRACTURE Right 11/17/2014   Procedure: OPEN REDUCTION INTERNAL FIXATION (ORIF) PROXIMAL HUMERUS FRACTURE;  Surgeon: Tania Ade, MD;  Location: [facility];  Service: Orthopedics;  Laterality: Right;  Open reduction internal fixation proximal humerus fracture right shoulder  . TONSILLECTOMY      There were no vitals filed for this visit.      Subjective Assessment - 01/15/17 1111    Subjective [name] reports she feels like her arms are getting stronger and that she is feeling like she is standing taller.    Patient Stated Goals walk better, and perform house activites easier.    Currently in Pain? Yes   Pain Score 6    Pain Location Shoulder   Pain Orientation Right   Pain Descriptors / Indicators Aching   Pain Type Acute pain   Pain Radiating Towards shoulder neck junction Rt , Lt SIJ/hip    Pain  Onset More than a month ago   Pain Frequency Constant   Aggravating Factors  vacuuming   Pain Relieving Factors ice heat and stretches.                          [facility] Adult PT Treatment/Exercise - 01/15/17 0001      Lumbar Exercises: Aerobic   Stationary Bike Nustep L5 x 7'     Lumbar Exercises: Supine   Clam 10 reps  with green band in a bridge, then 10 reps single leg clam   Bridge 20 reps  with legs straight on bolster   Other Supine Lumbar Exercises on whole bolster - OH pull with yellow band, horizontal abduction, marching - very difficult with lifting Rt LE. Opposite arm/leg lifts, SLR      Modalities   Modalities Electrical Stimulation;Moist Heat     Moist Heat Therapy   Number Minutes Moist Heat 15 Minutes   Moist Heat Location Lumbar Spine;Cervical  thoracic     Electrical Stimulation   Electrical Stimulation Location thoracic area   Electrical Stimulation Action IFC   Electrical Stimulation Parameters to tolerance   Electrical Stimulation Goals Pain;Tone  PT Short Term Goals - 01/15/17 1126      PT SHORT TERM GOAL #1   Title I with initial HEP for postural realignment ( 01/21/17)    Status Achieved     PT SHORT TERM GOAL #2   Title report =/> 25% reduction in back pain ( 01/21/17)    Status Achieved  30% improved     PT SHORT TERM GOAL #3   Title improve FOTO =/< 55% limited ( 01/21/17)      PT SHORT TERM GOAL #4   Title improve REEDCO posture score =/> 65/100 ( 01/21/17)    Status On-going           PT Long Term Goals - 01/15/17 1127      PT LONG TERM GOAL #1   Title I with advanced HEP to include gentle resistance with bands and body weight ( 02/04/17)    Status On-going     PT LONG TERM GOAL #2   Title report =/> 50% improvement in back pain to allow her to perform household chores easier ( 02/04/17)    Status On-going     PT LONG TERM GOAL #3   Title improve REEDCO posture score =/> 80/100 to  allow for upright posture with walking ( 02/04/17)    Status On-going     PT LONG TERM GOAL #4   Title improve FOTO =/< 48% limited, CK level ( 02/04/17)    Status On-going     PT LONG TERM GOAL #5   Title demo bilat hip abduction =/> 5-/5 ( 02/04/17)    Status On-going               Plan - 01/15/17 1157    Clinical Impression Statement [name] is making slow progress, she has met one STG and progressing to the others.  She requires VC to remind her to practice standing tall and moving out of her posture of comfort with her scoliosis.  She is able to self correct easier.  Pain is settle down as well.    Rehab Potential Excellent   PT Frequency 2x / week   PT Duration 8 weeks   PT Treatment/Interventions Moist Heat;Ultrasound;Therapeutic exercise;Dry needling;Manual techniques;Neuromuscular re-education;Cryotherapy;Electrical Stimulation;Patient/family education;Passive range of motion   PT Next Visit Plan g-code/foto/reedco and back of the body strengthening.    Consulted and Agree with Plan of Care Patient      Patient will benefit from skilled therapeutic intervention in order to improve the following deficits and impairments:  Difficulty walking, Increased muscle spasms, Pain, Improper body mechanics, Postural dysfunction, Decreased strength  Visit Diagnosis: Abnormal posture  Muscle weakness (generalized)  Other symptoms and signs involving the musculoskeletal system     Problem List Patient Active Problem List   Diagnosis Date Noted  . S/P ORIF (open reduction internal fixation) right proximal humeral fracture 11/17/2014  . Fracture of humerus, proximal, right, closed 11/16/2014  . Closed fracture of humerus, upper end 11/09/2014  . Osteoporosis 04/21/2014  . Facet arthritis of lumbar region ([place]) 04/12/2014  . Scoliosis (and kyphoscoliosis), idiopathic 03/28/2014  . Left cervical radiculopathy 03/28/2014  . Fibromyalgia 03/28/2014  . Foot pain 02/09/2013  .  Intracranial subdural hematoma ([place]) 01/06/2013  . Absolute anemia 01/06/2013  . Narcotic drug use 10/15/2012  . Impaired renal function 07/16/2012  . DDD (degenerative disc disease), cervical 07/16/2012  . Gout 06/15/2012  . HLD (hyperlipidemia) 01/02/2012  . Carotid arterial disease ([place]) 01/02/2012  . Anxiety 01/02/2012  . Carotid  artery disease ([place]) 01/02/2012  . Scoliosis 01/02/2012  . Calcification of coronary artery 11/02/2011  . Diabetes mellitus, type 2 ([place]) 08/30/2011  . Polypharmacy 08/30/2011  . Abdominal skin ptosis 08/30/2011  . Type 2 diabetes mellitus ([place]) 08/30/2011  . Blurred vision 01/31/2011  . VFD (visual field defect) 01/31/2011  . Cannot sleep 01/09/2011  . Essential (primary) hypertension 12/09/2007  . Fibrositis 12/09/2007  . Avitaminosis D 04/10/2007    Jeral Pinch PT  01/15/2017, 11:59 AM  [facility] [place], [place], [address] Phone: [phone]   Fax:  [phone]  Name: Bralynn Velador MRN: [id number] Date of Birth: Aug 22, 1945

## 2017-01-21 ENCOUNTER — Ambulatory Visit (INDEPENDENT_AMBULATORY_CARE_PROVIDER_SITE_OTHER): Payer: Medicare Other | Admitting: Physical Therapy

## 2017-01-21 ENCOUNTER — Encounter: Payer: Self-pay | Admitting: Physical Therapy

## 2017-01-21 DIAGNOSIS — M6281 Muscle weakness (generalized): Secondary | ICD-10-CM | POA: Diagnosis not present

## 2017-01-21 DIAGNOSIS — R29898 Other symptoms and signs involving the musculoskeletal system: Secondary | ICD-10-CM | POA: Diagnosis not present

## 2017-01-21 DIAGNOSIS — R293 Abnormal posture: Secondary | ICD-10-CM

## 2017-01-21 NOTE — Therapy (Signed)
[place], [place], [address] Phone: [phone]   Fax:  [phone]  Physical Therapy Treatment  Patient Details  Name: Kristina Shea MRN: [id number] Date of Birth: November 26, 1945 Referring Provider: Dr Dianah Field  Encounter Date: 01/21/2017      PT End of Session - 01/21/17 1156    Visit Number 10   Number of Visits 16   Date for PT Re-Evaluation 02/04/17   PT Start Time 1103   PT Stop Time 1205   PT Time Calculation (min) 62 min   Activity Tolerance Patient tolerated treatment well      Past Medical History:  Diagnosis Date  . Arthritis   . Diabetes mellitus without complication ([place])    type 2  . Fibromyalgia   . History of hiatal hernia   . Hypertension   . IBS (irritable bowel syndrome)   . Pneumonia   . Sleep apnea    many years ago, does not use cpap    Past Surgical History:  Procedure Laterality Date  . ABDOMINAL HYSTERECTOMY    . COLONOSCOPY    . EYE SURGERY Bilateral    cataract surgery with lens implant  . FRACTURE SURGERY     tendon surgery left little finger  . ORIF HUMERUS FRACTURE Right 11/17/2014   Procedure: OPEN REDUCTION INTERNAL FIXATION (ORIF) PROXIMAL HUMERUS FRACTURE;  Surgeon: Tania Ade, MD;  Location: [facility];  Service: Orthopedics;  Laterality: Right;  Open reduction internal fixation proximal humerus fracture right shoulder  . TONSILLECTOMY      There were no vitals filed for this visit.      Subjective Assessment - 01/21/17 1113    Subjective Kristina Shea reports her body is not having a good day.    Patient Stated Goals walk better, and perform house activites easier.    Currently in Pain? Yes   Pain Score (P)  7    Pain Location Sacrum   Pain Orientation Left   Pain Descriptors / Indicators Aching;Stabbing   Pain Type Chronic pain   Pain Onset In the past 7 days            [facility] PT Assessment - 01/21/17 0001      Assessment   Medical Diagnosis cervical and lumbar DDD   Referring Provider Dr Dianah Field   Onset Date/Surgical Date 03/11/16     Observation/Other Assessments   Focus on Therapeutic Outcomes (FOTO)  46% limited     Posture/Postural Control   Postural Limitations --  REEDCO score 53/100   Posture Comments 5'1" 7/8     Strength   Right Hip ABduction 4/5   Left Hip ABduction 4+/5                     [facility] Adult PT Treatment/Exercise - 01/21/17 0001      Lumbar Exercises: Supine   Other Supine Lumbar Exercises over pool noodle cross body,moving it from lower thoracic up to shoulder blades, then over soft wedge with good morning stretch   Other Supine Lumbar Exercises 2x10 head presses into pillow.      Modalities   Modalities Electrical Stimulation;Moist Heat     Moist Heat Therapy   Number Minutes Moist Heat 15 Minutes   Moist Heat Location Lumbar Spine;Cervical  thoracic     Electrical Stimulation   Electrical Stimulation Location thoracic area   Electrical Stimulation Action IFC   Electrical Stimulation Parameters to tolerance   Electrical Stimulation Goals Pain;Tone  PT Short Term Goals - January 26, 2017 1141      PT SHORT TERM GOAL #1   Title I with initial HEP for postural realignment ( Jan 26, 2017)    Status Achieved     PT SHORT TERM GOAL #2   Title report =/> 25% reduction in back pain ( January 26, 2017)    Status Achieved     PT SHORT TERM GOAL #3   Title improve FOTO =/< 55% limited ( 01/26/2017)    Status Achieved  46% limited     PT SHORT TERM GOAL #4   Title improve REEDCO posture score =/> 65/100 ( 01-26-17)    Status On-going  scored 53/100           PT Long Term Goals - 01-26-17 1141      PT LONG TERM GOAL #1   Title I with advanced HEP to include gentle resistance with bands and body weight ( 02/04/17)    Status On-going     PT LONG TERM GOAL #2   Title report =/> 50% improvement in back pain to allow her to perform  household chores easier ( 02/04/17)      PT LONG TERM GOAL #3   Title improve REEDCO posture score =/> 80/100 to allow for upright posture with walking ( 02/04/17)    Status On-going     PT LONG TERM GOAL #4   Title improve FOTO =/< 48% limited, CK level ( 02/04/17)    Status Partially Met  CK level, however score not there     PT LONG TERM GOAL #5   Title demo bilat hip abduction =/> 5-/5 ( 02/04/17)    Status On-going               Plan - 26-Jan-2017 1157    Clinical Impression Statement Kristina Shea is making progress, she has met another STG set.  Her posture still is impaired due to scoliosis however showing some improvement.  Her hip strength is also improving.  Her prgress was a bit limited due to occurrence of Rt sided rib pain setting her back.  She is able to self correct some of her lateral trunk shift and hold it for short periods of time.  reported decreased back pain after treatment by 2-3 points.    Rehab Potential Excellent   PT Frequency 2x / week   PT Treatment/Interventions Moist Heat;Ultrasound;Therapeutic exercise;Dry needling;Manual techniques;Neuromuscular re-education;Cryotherapy;Electrical Stimulation;Patient/family education;Passive range of motion   PT Next Visit Plan continue with postural realignment using MEEKS method.    Consulted and Agree with Plan of Care Patient      Patient will benefit from skilled therapeutic intervention in order to improve the following deficits and impairments:  Difficulty walking, Increased muscle spasms, Pain, Improper body mechanics, Postural dysfunction, Decreased strength  Visit Diagnosis: Muscle weakness (generalized)  Other symptoms and signs involving the musculoskeletal system  Abnormal posture       [facility] PT PB G-CODES - January 26, 2017 1156    Functional Assessment Tool Used  FOTO, REEDCO and professional judgement    Functional Limitations Other PT primary   Other PT Primary Current Status [phone]) At least 40 percent  but less than 60 percent impaired, limited or restricted   Other PT Primary Goal Status ([id number]) At least 40 percent but less than 60 percent impaired, limited or restricted      Problem List Patient Active Problem List   Diagnosis Date Noted  . S/P ORIF (open reduction internal fixation) right proximal humeral fracture  11/17/2014  . Fracture of humerus, proximal, right, closed 11/16/2014  . Closed fracture of humerus, upper end 11/09/2014  . Osteoporosis 04/21/2014  . Facet arthritis of lumbar region ([place]) 04/12/2014  . Scoliosis (and kyphoscoliosis), idiopathic 03/28/2014  . Left cervical radiculopathy 03/28/2014  . Fibromyalgia 03/28/2014  . Foot pain 02/09/2013  . Intracranial subdural hematoma ([place]) 01/06/2013  . Absolute anemia 01/06/2013  . Narcotic drug use 10/15/2012  . Impaired renal function 07/16/2012  . DDD (degenerative disc disease), cervical 07/16/2012  . Gout 06/15/2012  . HLD (hyperlipidemia) 01/02/2012  . Carotid arterial disease ([place]) 01/02/2012  . Anxiety 01/02/2012  . Carotid artery disease ([place]) 01/02/2012  . Scoliosis 01/02/2012  . Calcification of coronary artery 11/02/2011  . Diabetes mellitus, type 2 ([place]) 08/30/2011  . Polypharmacy 08/30/2011  . Abdominal skin ptosis 08/30/2011  . Type 2 diabetes mellitus ([place]) 08/30/2011  . Blurred vision 01/31/2011  . VFD (visual field defect) 01/31/2011  . Cannot sleep 01/09/2011  . Essential (primary) hypertension 12/09/2007  . Fibrositis 12/09/2007  . Avitaminosis D 04/10/2007    Jeral Pinch PT  01/21/2017, 12:01 PM  [facility] [place], [place], [address] Phone: [phone]   Fax:  [phone]  Name: Kristina Shea MRN: [id number] Date of Birth: [birth year]

## 2017-01-23 ENCOUNTER — Ambulatory Visit (INDEPENDENT_AMBULATORY_CARE_PROVIDER_SITE_OTHER): Payer: Medicare Other | Admitting: Physical Therapy

## 2017-01-23 ENCOUNTER — Encounter: Payer: Self-pay | Admitting: Physical Therapy

## 2017-01-23 DIAGNOSIS — R29898 Other symptoms and signs involving the musculoskeletal system: Secondary | ICD-10-CM

## 2017-01-23 DIAGNOSIS — H33303 Unspecified retinal break, bilateral: Secondary | ICD-10-CM | POA: Diagnosis not present

## 2017-01-23 DIAGNOSIS — H35363 Drusen (degenerative) of macula, bilateral: Secondary | ICD-10-CM | POA: Diagnosis not present

## 2017-01-23 DIAGNOSIS — R293 Abnormal posture: Secondary | ICD-10-CM

## 2017-01-23 DIAGNOSIS — H5203 Hypermetropia, bilateral: Secondary | ICD-10-CM | POA: Diagnosis not present

## 2017-01-23 DIAGNOSIS — Z961 Presence of intraocular lens: Secondary | ICD-10-CM | POA: Diagnosis not present

## 2017-01-23 DIAGNOSIS — E119 Type 2 diabetes mellitus without complications: Secondary | ICD-10-CM | POA: Diagnosis not present

## 2017-01-23 DIAGNOSIS — H04129 Dry eye syndrome of unspecified lacrimal gland: Secondary | ICD-10-CM | POA: Diagnosis not present

## 2017-01-23 DIAGNOSIS — H52223 Regular astigmatism, bilateral: Secondary | ICD-10-CM | POA: Diagnosis not present

## 2017-01-23 DIAGNOSIS — H524 Presbyopia: Secondary | ICD-10-CM | POA: Diagnosis not present

## 2017-01-23 DIAGNOSIS — M6281 Muscle weakness (generalized): Secondary | ICD-10-CM | POA: Diagnosis not present

## 2017-01-23 NOTE — Therapy (Signed)
[place], [place], [address] Phone: [phone]   Fax:  [phone]  Physical Therapy Treatment  Patient Details  Name: Kristina Shea MRN: [id number] Date of Birth: 1945/08/08 Referring Provider: Dr Dianah Field  Encounter Date: 01/23/2017      PT End of Session - 01/23/17 1406    Visit Number 11   Number of Visits 16   Date for PT Re-Evaluation 02/04/17   PT Start Time 1406   PT Stop Time 1459   PT Time Calculation (min) 53 min   Activity Tolerance Patient tolerated treatment well      Past Medical History:  Diagnosis Date  . Arthritis   . Diabetes mellitus without complication ([place])    type 2  . Fibromyalgia   . History of hiatal hernia   . Hypertension   . IBS (irritable bowel syndrome)   . Pneumonia   . Sleep apnea    many years ago, does not use cpap    Past Surgical History:  Procedure Laterality Date  . ABDOMINAL HYSTERECTOMY    . COLONOSCOPY    . EYE SURGERY Bilateral    cataract surgery with lens implant  . FRACTURE SURGERY     tendon surgery left little finger  . ORIF HUMERUS FRACTURE Right 11/17/2014   Procedure: OPEN REDUCTION INTERNAL FIXATION (ORIF) PROXIMAL HUMERUS FRACTURE;  Surgeon: Tania Ade, MD;  Location: [facility];  Service: Orthopedics;  Laterality: Right;  Open reduction internal fixation proximal humerus fracture right shoulder  . TONSILLECTOMY      There were no vitals filed for this visit.      Subjective Assessment - 01/23/17 1407    Subjective Kristina Shea had an eye appointment today and they dialated her eyes, she has been busy today so she hasn't had time to think about being in pain   Patient Stated Goals walk better, and perform house activites easier.    Currently in Pain? No/denies                         [facility] Adult PT Treatment/Exercise - 01/23/17 0001      Lumbar Exercises: Aerobic   Stationary Bike Nustep L4x6'      Lumbar Exercises: Supine   Other Supine Lumbar Exercises 2x10 Lt sidebend in hooklying   Other Supine Lumbar Exercises supine over bolster under thoracic area with therapist assist to stretch out - pt holding PT's legs with slight lean back.      Lumbar Exercises: Prone   Opposite Arm/Leg Raise Left arm/Right leg;Right arm/Left leg;20 reps   Other Prone Lumbar Exercises 2x10 T's     Modalities   Modalities Electrical Stimulation;Moist Heat     Moist Heat Therapy   Number Minutes Moist Heat 15 Minutes   Moist Heat Location Cervical;Lumbar Spine  and thoracic     Electrical Stimulation   Electrical Stimulation Location thoracic area   Electrical Stimulation Action IFC    Electrical Stimulation Parameters to tolerance   Electrical Stimulation Goals Pain;Tone     Manual Therapy   Manual Therapy Soft tissue mobilization;Manual Traction   Soft tissue mobilization STM to back of occipput and cervical lumbar paraspinals, Rt side tighter than left.    Manual Traction very gentle cervical traction, pt reports small pop with relief of tightness                  PT Short Term Goals - 01/21/17 1141  PT SHORT TERM GOAL #1   Title I with initial HEP for postural realignment ( 01/21/17)    Status Achieved     PT SHORT TERM GOAL #2   Title report =/> 25% reduction in back pain ( 01/21/17)    Status Achieved     PT SHORT TERM GOAL #3   Title improve FOTO =/< 55% limited ( 01/21/17)    Status Achieved  46% limited     PT SHORT TERM GOAL #4   Title improve REEDCO posture score =/> 65/100 ( 01/21/17)    Status On-going  scored 53/100           PT Long Term Goals - 01/21/17 1141      PT LONG TERM GOAL #1   Title I with advanced HEP to include gentle resistance with bands and body weight ( 02/04/17)    Status On-going     PT LONG TERM GOAL #2   Title report =/> 50% improvement in back pain to allow her to perform household chores easier ( 02/04/17)      PT LONG  TERM GOAL #3   Title improve REEDCO posture score =/> 80/100 to allow for upright posture with walking ( 02/04/17)    Status On-going     PT LONG TERM GOAL #4   Title improve FOTO =/< 48% limited, CK level ( 02/04/17)    Status Partially Met  CK level, however score not there     PT LONG TERM GOAL #5   Title demo bilat hip abduction =/> 5-/5 ( 02/04/17)    Status On-going               Plan - 01/23/17 1445    Clinical Impression Statement Kristina Shea tolerated thoracic stretches better today, still has tightness.  She did fatigue with prone strenthening    Rehab Potential Excellent   PT Frequency 2x / week   PT Duration 8 weeks   PT Treatment/Interventions Moist Heat;Ultrasound;Therapeutic exercise;Dry needling;Manual techniques;Neuromuscular re-education;Cryotherapy;Electrical Stimulation;Patient/family education;Passive range of motion   PT Next Visit Plan continue with postural realignment using MEEKS method.    Consulted and Agree with Plan of Care Patient      Patient will benefit from skilled therapeutic intervention in order to improve the following deficits and impairments:  Difficulty walking, Increased muscle spasms, Pain, Improper body mechanics, Postural dysfunction, Decreased strength  Visit Diagnosis: Abnormal posture  Other symptoms and signs involving the musculoskeletal system  Muscle weakness (generalized)     Problem List Patient Active Problem List   Diagnosis Date Noted  . S/P ORIF (open reduction internal fixation) right proximal humeral fracture 11/17/2014  . Fracture of humerus, proximal, right, closed 11/16/2014  . Closed fracture of humerus, upper end 11/09/2014  . Osteoporosis 04/21/2014  . Facet arthritis of lumbar region ([place]) 04/12/2014  . Scoliosis (and kyphoscoliosis), idiopathic 03/28/2014  . Left cervical radiculopathy 03/28/2014  . Fibromyalgia 03/28/2014  . Foot pain 02/09/2013  . Intracranial subdural hematoma ([place]) 01/06/2013   . Absolute anemia 01/06/2013  . Narcotic drug use 10/15/2012  . Impaired renal function 07/16/2012  . DDD (degenerative disc disease), cervical 07/16/2012  . Gout 06/15/2012  . HLD (hyperlipidemia) 01/02/2012  . Carotid arterial disease ([place]) 01/02/2012  . Anxiety 01/02/2012  . Carotid artery disease ([place]) 01/02/2012  . Scoliosis 01/02/2012  . Calcification of coronary artery 11/02/2011  . Diabetes mellitus, type 2 ([place]) 08/30/2011  . Polypharmacy 08/30/2011  . Abdominal skin ptosis 08/30/2011  . Type 2  diabetes mellitus ([place]) 08/30/2011  . Blurred vision 01/31/2011  . VFD (visual field defect) 01/31/2011  . Cannot sleep 01/09/2011  . Essential (primary) hypertension 12/09/2007  . Fibrositis 12/09/2007  . Avitaminosis D 04/10/2007    Jeral Pinch PT  01/23/2017, 2:48 PM  [facility] [place], [place], [address] Phone: [phone]   Fax:  [phone]  Name: Deshanda Molitor MRN: [id number] Date of Birth: January 10, 1946

## 2017-01-28 ENCOUNTER — Ambulatory Visit (INDEPENDENT_AMBULATORY_CARE_PROVIDER_SITE_OTHER): Payer: Medicare Other | Admitting: Physical Therapy

## 2017-01-28 ENCOUNTER — Encounter: Payer: Self-pay | Admitting: Physical Therapy

## 2017-01-28 DIAGNOSIS — M6281 Muscle weakness (generalized): Secondary | ICD-10-CM

## 2017-01-28 DIAGNOSIS — R29898 Other symptoms and signs involving the musculoskeletal system: Secondary | ICD-10-CM | POA: Diagnosis not present

## 2017-01-28 DIAGNOSIS — R293 Abnormal posture: Secondary | ICD-10-CM

## 2017-01-28 NOTE — Therapy (Signed)
[place], [place], [address] Phone: [phone]   Fax:  [phone]  Physical Therapy Treatment  Patient Details  Name: Kristina Shea MRN: [id number] Date of Birth: 29-Aug-1945 Referring Provider: Dr Dianah Field   Encounter Date: 01/28/2017  PT End of Session - 01/28/17 0938    Visit Number  12    Number of Visits  16    Date for PT Re-Evaluation  02/04/17    PT Start Time  0938    PT Stop Time  1040    PT Time Calculation (min)  62 min    Activity Tolerance  Patient tolerated treatment well       Past Medical History:  Diagnosis Date  . Arthritis   . Diabetes mellitus without complication ([place])    type 2  . Fibromyalgia   . History of hiatal hernia   . Hypertension   . IBS (irritable bowel syndrome)   . Pneumonia   . Sleep apnea    many years ago, does not use cpap    Past Surgical History:  Procedure Laterality Date  . ABDOMINAL HYSTERECTOMY    . COLONOSCOPY    . EYE SURGERY Bilateral    cataract surgery with lens implant  . FRACTURE SURGERY     tendon surgery left little finger  . TONSILLECTOMY      There were no vitals filed for this visit.  Subjective Assessment - 01/28/17 0938    Subjective  Kristina Shea reports she is feeling ok today, rainy weather sometimes bothers her     Patient Stated Goals  walk better, and perform house activites easier.     Currently in Pain?  Yes    Pain Score  5     Pain Location  Thoracic    Pain Orientation  Right    Pain Descriptors / Indicators  Aching    Pain Type  Chronic pain    Pain Radiating Towards  sometimes into SIJ - stabbing    Pain Onset  1 to 4 weeks ago    Pain Frequency  Constant    Aggravating Factors   not sure    Pain Relieving Factors  ice and stretches         [facility] PT Assessment - 01/28/17 0001      Assessment   Medical Diagnosis  cervical and lumbar DDD                  [facility] Adult PT  Treatment/Exercise - 01/28/17 0001      Lumbar Exercises: Aerobic   Stationary Bike  Nustep L5x6'      Lumbar Exercises: Standing   Other Standing Lumbar Exercises  upper shifts to the Lt with arms abducted, 10 x 10 sec holds. VC for form      Lumbar Exercises: Supine   Other Supine Lumbar Exercises  3x10 hooklying lat pull down holding 3# wts in each hand      Lumbar Exercises: Sidelying   Clam  10 reps 3 sets reverse with 3# wts   3 sets reverse with 3# wts   Other Sidelying Lumbar Exercises  3x10 upper body rotations to the LT with red band      Modalities   Modalities  Electrical Stimulation;Moist Heat      Moist Heat Therapy   Number Minutes Moist Heat  20 Minutes    Moist Heat Location  Cervical;Lumbar Spine thoracic   thoracic     Electrical  Stimulation   Electrical Stimulation Location  thoracic area    Electrical Stimulation Action  IFC    Electrical Stimulation Parameters   to tolerance    Electrical Stimulation Goals  Pain;Tone               PT Short Term Goals - 01/28/17 4825      PT SHORT TERM GOAL #1   Title  I with initial HEP for postural realignment ( 01/21/17)     Status  Achieved      PT SHORT TERM GOAL #2   Title  report =/> 25% reduction in back pain ( 01/21/17)     Status  Achieved      PT SHORT TERM GOAL #3   Title  improve FOTO =/< 55% limited ( 01/21/17)     Status  Achieved      PT SHORT TERM GOAL #4   Title  improve REEDCO posture score =/> 65/100 ( 01/21/17)     Status  On-going        PT Long Term Goals - 01/28/17 0943      PT LONG TERM GOAL #1   Title  I with advanced HEP to include gentle resistance with bands and body weight ( 02/04/17)     Status  On-going      PT LONG TERM GOAL #2   Title  report =/> 50% improvement in back pain to allow her to perform household chores easier ( 02/04/17)     Status  On-going      PT LONG TERM GOAL #3   Title  improve REEDCO posture score =/> 80/100 to allow for upright posture  with walking ( 02/04/17)     Status  On-going      PT LONG TERM GOAL #4   Title  improve FOTO =/< 48% limited, CK level ( 02/04/17)     Status  Partially Met      PT LONG TERM GOAL #5   Title  demo bilat hip abduction =/> 5-/5 ( 02/04/17)             Plan - 01/28/17 1018    Clinical Impression Statement  Kristina Shea continues to make slow gains in pain reduction and improving her alignment.  She is also making slow progress to her goals.     Rehab Potential  Excellent    PT Frequency  2x / week    PT Duration  8 weeks    PT Treatment/Interventions  Moist Heat;Ultrasound;Therapeutic exercise;Dry needling;Manual techniques;Neuromuscular re-education;Cryotherapy;Electrical Stimulation;Patient/family education;Passive range of motion    PT Next Visit Plan  continue with postural realignment using MEEKS method.     Consulted and Agree with Plan of Care  Patient       Patient will benefit from skilled therapeutic intervention in order to improve the following deficits and impairments:  Difficulty walking, Increased muscle spasms, Pain, Improper body mechanics, Postural dysfunction, Decreased strength  Visit Diagnosis: Muscle weakness (generalized)  Other symptoms and signs involving the musculoskeletal system  Abnormal posture     Problem List Patient Active Problem List   Diagnosis Date Noted  . S/P ORIF (open reduction internal fixation) right proximal humeral fracture 11/17/2014  . Fracture of humerus, proximal, right, closed 11/16/2014  . Closed fracture of humerus, upper end 11/09/2014  . Osteoporosis 04/21/2014  . Facet arthritis of lumbar region ([place]) 04/12/2014  . Scoliosis (and kyphoscoliosis), idiopathic 03/28/2014  . Left cervical radiculopathy 03/28/2014  . Fibromyalgia 03/28/2014  . Foot  pain 02/09/2013  . Intracranial subdural hematoma ([place]) 01/06/2013  . Absolute anemia 01/06/2013  . Narcotic drug use 10/15/2012  . Impaired renal function 07/16/2012  . DDD  (degenerative disc disease), cervical 07/16/2012  . Gout 06/15/2012  . HLD (hyperlipidemia) 01/02/2012  . Carotid arterial disease ([place]) 01/02/2012  . Anxiety 01/02/2012  . Carotid artery disease ([place]) 01/02/2012  . Scoliosis 01/02/2012  . Calcification of coronary artery 11/02/2011  . Diabetes mellitus, type 2 ([place]) 08/30/2011  . Polypharmacy 08/30/2011  . Abdominal skin ptosis 08/30/2011  . Type 2 diabetes mellitus ([place]) 08/30/2011  . Blurred vision 01/31/2011  . VFD (visual field defect) 01/31/2011  . Cannot sleep 01/09/2011  . Essential (primary) hypertension 12/09/2007  . Fibrositis 12/09/2007  . Avitaminosis D 04/10/2007    Jeral Pinch PT  01/28/2017, 10:20 AM  [facility] [place], [place], [address] Phone: [phone]   Fax:  [phone]  Name: Kristina Shea MRN: [id number] Date of Birth: Jun 29, 1945

## 2017-01-30 ENCOUNTER — Encounter: Payer: Medicare Other | Admitting: Physical Therapy

## 2017-01-30 ENCOUNTER — Telehealth: Payer: Self-pay | Admitting: Physical Therapy

## 2017-01-30 NOTE — Telephone Encounter (Signed)
Olegario MessierKathy missed her 11AM appointment, she apologized, thought her appointment was at 1145 .  She will do her stretches and see us on Monday.

## 2017-02-03 ENCOUNTER — Ambulatory Visit (INDEPENDENT_AMBULATORY_CARE_PROVIDER_SITE_OTHER): Payer: Medicare Other | Admitting: Physical Therapy

## 2017-02-03 ENCOUNTER — Encounter: Payer: Self-pay | Admitting: Physical Therapy

## 2017-02-03 DIAGNOSIS — M6281 Muscle weakness (generalized): Secondary | ICD-10-CM

## 2017-02-03 DIAGNOSIS — R29898 Other symptoms and signs involving the musculoskeletal system: Secondary | ICD-10-CM

## 2017-02-03 DIAGNOSIS — R293 Abnormal posture: Secondary | ICD-10-CM | POA: Diagnosis not present

## 2017-02-03 NOTE — Therapy (Signed)
[place], [place], [address] Phone: [phone]   Fax:  [phone]  Physical Therapy Treatment  Patient Details  Name: Kristina Shea MRN: [id number] Date of Birth: 11/07/1945 Referring Provider: Dr Dianah Field   Encounter Date: 02/03/2017  PT End of Session - 02/03/17 1100    Visit Number  13    Number of Visits  16    Date for PT Re-Evaluation  02/04/17    PT Start Time  1101    PT Stop Time  1208    PT Time Calculation (min)  67 min    Activity Tolerance  Patient tolerated treatment well       Past Medical History:  Diagnosis Date  . Arthritis   . Diabetes mellitus without complication ([place])    type 2  . Fibromyalgia   . History of hiatal hernia   . Hypertension   . IBS (irritable bowel syndrome)   . Pneumonia   . Sleep apnea    many years ago, does not use cpap    Past Surgical History:  Procedure Laterality Date  . ABDOMINAL HYSTERECTOMY    . COLONOSCOPY    . EYE SURGERY Bilateral    cataract surgery with lens implant  . FRACTURE SURGERY     tendon surgery left little finger  . TONSILLECTOMY      There were no vitals filed for this visit.  Subjective Assessment - 02/03/17 1102    Currently in Pain?  Yes    Pain Score  4     Pain Location  Thoracic    Pain Orientation  Right    Pain Descriptors / Indicators  Aching    Pain Type  Chronic pain    Pain Onset  More than a month ago    Pain Frequency  Constant    Aggravating Factors   cleaning the oven    Pain Relieving Factors  ice and stretches                      [facility] Adult PT Treatment/Exercise - 02/03/17 0001      Lumbar Exercises: Aerobic   Stationary Bike  Nustep L5x6'      Lumbar Exercises: Supine   Ab Set  10 reps LTR with abd engagement to lift legs up.     Other Supine Lumbar Exercises  6x5 holding bridge, overhead pulls with 3# each hand.     Other Supine Lumbar Exercises  2x 5  reps PPT bring legs into table top 10x10sec thoracic lift with "cover the bones"                PT Short Term Goals - 01/28/17 0943      PT SHORT TERM GOAL #1   Title  I with initial HEP for postural realignment ( 01/21/17)     Status  Achieved      PT SHORT TERM GOAL #2   Title  report =/> 25% reduction in back pain ( 01/21/17)     Status  Achieved      PT SHORT TERM GOAL #3   Title  improve FOTO =/< 55% limited ( 01/21/17)     Status  Achieved      PT SHORT TERM GOAL #4   Title  improve REEDCO posture score =/> 65/100 ( 01/21/17)     Status  On-going        PT Long Term Goals - 01/28/17 2671  PT LONG TERM GOAL #1   Title  I with advanced HEP to include gentle resistance with bands and body weight ( 02/04/17)     Status  On-going      PT LONG TERM GOAL #2   Title  report =/> 50% improvement in back pain to allow her to perform household chores easier ( 02/04/17)     Status  On-going      PT LONG TERM GOAL #3   Title  improve REEDCO posture score =/> 80/100 to allow for upright posture with walking ( 02/04/17)     Status  On-going      PT LONG TERM GOAL #4   Title  improve FOTO =/< 48% limited, CK level ( 02/04/17)     Status  Partially Met      PT LONG TERM GOAL #5   Title  demo bilat hip abduction =/> 5-/5 ( 02/04/17)             Plan - 02/03/17 1157    Clinical Impression Statement  [name] had great form with her exercise today engaging her core after instruction.  She is presenting in the clinic with more observable upright posture with less cueing.  Had some pain initiallly with LTR and abdominal engagement, settled down after reps.     Rehab Potential  Excellent    PT Frequency  2x / week    PT Duration  8 weeks    PT Treatment/Interventions  Moist Heat;Ultrasound;Therapeutic exercise;Dry needling;Manual techniques;Neuromuscular re-education;Cryotherapy;Electrical Stimulation;Patient/family education;Passive range of motion    PT Next Visit  Plan  renewal if patient is agreeable, discuss decreasing frequency.     Consulted and Agree with Plan of Care  Patient       Patient will benefit from skilled therapeutic intervention in order to improve the following deficits and impairments:  Difficulty walking, Increased muscle spasms, Pain, Improper body mechanics, Postural dysfunction, Decreased strength  Visit Diagnosis: Muscle weakness (generalized)  Other symptoms and signs involving the musculoskeletal system  Abnormal posture     Problem List Patient Active Problem List   Diagnosis Date Noted  . S/P ORIF (open reduction internal fixation) right proximal humeral fracture 11/17/2014  . Fracture of humerus, proximal, right, closed 11/16/2014  . Closed fracture of humerus, upper end 11/09/2014  . Osteoporosis 04/21/2014  . Facet arthritis of lumbar region ([place]) 04/12/2014  . Scoliosis (and kyphoscoliosis), idiopathic 03/28/2014  . Left cervical radiculopathy 03/28/2014  . Fibromyalgia 03/28/2014  . Foot pain 02/09/2013  . Intracranial subdural hematoma ([place]) 01/06/2013  . Absolute anemia 01/06/2013  . Narcotic drug use 10/15/2012  . Impaired renal function 07/16/2012  . DDD (degenerative disc disease), cervical 07/16/2012  . Gout 06/15/2012  . HLD (hyperlipidemia) 01/02/2012  . Carotid arterial disease ([place]) 01/02/2012  . Anxiety 01/02/2012  . Carotid artery disease ([place]) 01/02/2012  . Scoliosis 01/02/2012  . Calcification of coronary artery 11/02/2011  . Diabetes mellitus, type 2 ([place]) 08/30/2011  . Polypharmacy 08/30/2011  . Abdominal skin ptosis 08/30/2011  . Type 2 diabetes mellitus ([place]) 08/30/2011  . Blurred vision 01/31/2011  . VFD (visual field defect) 01/31/2011  . Cannot sleep 01/09/2011  . Essential (primary) hypertension 12/09/2007  . Fibrositis 12/09/2007  . Avitaminosis D 04/10/2007    Jeral Pinch PT  02/03/2017, 12:00 PM  [facility]  [place], [place], [address] Phone: [phone]   Fax:  [phone]  Name: Maham Quintin MRN: [id number] Date of Birth:  07/09/1945   

## 2017-02-05 ENCOUNTER — Encounter: Payer: Medicare Other | Admitting: Physical Therapy

## 2017-02-06 ENCOUNTER — Encounter: Payer: Medicare Other | Admitting: Physical Therapy

## 2017-02-07 ENCOUNTER — Other Ambulatory Visit: Payer: Self-pay | Admitting: Sports Medicine

## 2017-02-10 ENCOUNTER — Ambulatory Visit (INDEPENDENT_AMBULATORY_CARE_PROVIDER_SITE_OTHER): Payer: Medicare Other | Admitting: Physical Therapy

## 2017-02-10 ENCOUNTER — Encounter: Payer: Self-pay | Admitting: Physical Therapy

## 2017-02-10 DIAGNOSIS — R293 Abnormal posture: Secondary | ICD-10-CM | POA: Diagnosis not present

## 2017-02-10 DIAGNOSIS — R29898 Other symptoms and signs involving the musculoskeletal system: Secondary | ICD-10-CM | POA: Diagnosis not present

## 2017-02-10 DIAGNOSIS — M6281 Muscle weakness (generalized): Secondary | ICD-10-CM

## 2017-02-10 NOTE — Therapy (Signed)
[place], [place], [address] Phone: [phone]   Fax:  [phone]  Physical Therapy Treatment  Patient Details  Name: Kristina Shea MRN: [id number] Date of Birth: Jun 23, 1945 Referring Provider: Dr Dianah Field   Encounter Date: 02/10/2017  PT End of Session - 02/10/17 1100    Visit Number  14    Number of Visits  18    Date for PT Re-Evaluation  03/10/17    PT Start Time  1100    PT Stop Time  1206    PT Time Calculation (min)  66 min    Activity Tolerance  Patient tolerated treatment well       Past Medical History:  Diagnosis Date  . Arthritis   . Diabetes mellitus without complication ([place])    type 2  . Fibromyalgia   . History of hiatal hernia   . Hypertension   . IBS (irritable bowel syndrome)   . Pneumonia   . Sleep apnea    many years ago, does not use cpap    Past Surgical History:  Procedure Laterality Date  . ABDOMINAL HYSTERECTOMY    . COLONOSCOPY    . EYE SURGERY Bilateral    cataract surgery with lens implant  . FRACTURE SURGERY     tendon surgery left little finger  . OPEN REDUCTION INTERNAL FIXATION (ORIF) PROXIMAL HUMERUS FRACTURE Right 11/17/2014   Performed by Tania Ade, MD at [place]  . TONSILLECTOMY      There were no vitals filed for this visit.  Subjective Assessment - 02/10/17 1100    Subjective  [name] reports she is sorry she missed her appointment because she lost her key.     Patient Stated Goals  walk better, and perform house activites easier.     Currently in Pain?  Yes    Pain Score  4     Pain Location  Thoracic    Pain Orientation  Right    Pain Descriptors / Indicators  Aching    Pain Type  Chronic pain    Pain Onset  More than a month ago    Pain Frequency  Constant    Aggravating Factors   any house hold activity where she has to bend forward     Pain Relieving Factors  ice and stretches.          [facility] PT Assessment -  02/10/17 0001      Assessment   Medical Diagnosis  cervical and lumbar DDD    Referring Provider  Dr Dianah Field    Onset Date/Surgical Date  03/11/16      Observation/Other Assessments   Focus on Therapeutic Outcomes (FOTO)   41% limited      Posture/Postural Control   Posture Comments  _0       Strength   Right Hip ABduction  4+/5    Left Hip ABduction  -- 5-/5                  [facility] Adult PT Treatment/Exercise - 02/10/17 0001      Lumbar Exercises: Aerobic   Stationary Bike  Nustep L5x7'      Lumbar Exercises: Supine   Other Supine Lumbar Exercises  2x10, 5 sec holds head presses into ball off EOB    Other Supine Lumbar Exercises  stretching over BOSU ball with arms overhead and legs straight.       Lumbar Exercises: Sidelying   Other Sidelying Lumbar Exercises  3x10 Rt oblique lifts off BOSU ball.                PT Short Term Goals - 01/28/17 9833      PT SHORT TERM GOAL #1   Title  I with initial HEP for postural realignment ( 01/21/17)     Status  Achieved      PT SHORT TERM GOAL #2   Title  report =/> 25% reduction in back pain ( 01/21/17)     Status  Achieved      PT SHORT TERM GOAL #3   Title  improve FOTO =/< 55% limited ( 01/21/17)     Status  Achieved      PT SHORT TERM GOAL #4   Title  improve REEDCO posture score =/> 65/100 ( 01/21/17)     Status  On-going        PT Long Term Goals - 02/10/17 1106      PT LONG TERM GOAL #1   Title  I with advanced HEP to include gentle resistance with bands and body weight ( 03/10/17)     Time  4    Status  On-going      PT LONG TERM GOAL #2   Title  report =/> 50% improvement in back pain to allow her to perform household chores easier ( 03/10/17)     Time  4    Status  On-going 25% improved       PT LONG TERM GOAL #3   Title  improve REEDCO posture score =/> 80/100 to allow for upright posture with walking ( 03/10/17)     Time  4    Status  On-going 60/100      PT LONG TERM  GOAL #4   Title  improve FOTO =/< 48% limited, CK level ( 02/04/17)     Status  Achieved 41% limited Ck level      PT LONG TERM GOAL #5   Title  demo bilat hip abduction =/> 5-/5 ( 03/10/17)     Time  4    Status  Partially Met            Plan - 02/10/17 1118    Clinical Impression Statement  [name] continues to make improvements.  She has partially met her goals.  Her progress has been slow and consistent, her posture has improved and she has added 1/2" to her height.  She has the biggest postural issues through her thoracic and cervical spine in standing.  She is able to achieve better alignment when supine.      Rehab Potential  Excellent    PT Frequency  1x / week    PT Duration  4 weeks    PT Treatment/Interventions  Moist Heat;Ultrasound;Therapeutic exercise;Dry needling;Manual techniques;Neuromuscular re-education;Cryotherapy;Electrical Stimulation;Patient/family education;Passive range of motion    PT Next Visit Plan  focus on cervical and thoracic aligntment.     Consulted and Agree with Plan of Care  Patient       Patient will benefit from skilled therapeutic intervention in order to improve the following deficits and impairments:  Difficulty walking, Increased muscle spasms, Pain, Improper body mechanics, Postural dysfunction, Decreased strength  Visit Diagnosis: Muscle weakness (generalized) - Plan: PT plan of care cert/re-cert  Other symptoms and signs involving the musculoskeletal system - Plan: PT plan of care cert/re-cert  Abnormal posture - Plan: PT plan of care cert/re-cert     Problem List Patient Active Problem List   Diagnosis  Date Noted  . S/P ORIF (open reduction internal fixation) right proximal humeral fracture 11/17/2014  . Fracture of humerus, proximal, right, closed 11/16/2014  . Closed fracture of humerus, upper end 11/09/2014  . Osteoporosis 04/21/2014  . Facet arthritis of lumbar region ([place]) 04/12/2014  . Scoliosis (and kyphoscoliosis),  idiopathic 03/28/2014  . Left cervical radiculopathy 03/28/2014  . Fibromyalgia 03/28/2014  . Foot pain 02/09/2013  . Intracranial subdural hematoma ([place]) 01/06/2013  . Absolute anemia 01/06/2013  . Narcotic drug use 10/15/2012  . Impaired renal function 07/16/2012  . DDD (degenerative disc disease), cervical 07/16/2012  . Gout 06/15/2012  . HLD (hyperlipidemia) 01/02/2012  . Carotid arterial disease ([place]) 01/02/2012  . Anxiety 01/02/2012  . Carotid artery disease ([place]) 01/02/2012  . Scoliosis 01/02/2012  . Calcification of coronary artery 11/02/2011  . Diabetes mellitus, type 2 ([place]) 08/30/2011  . Polypharmacy 08/30/2011  . Abdominal skin ptosis 08/30/2011  . Type 2 diabetes mellitus ([place]) 08/30/2011  . Blurred vision 01/31/2011  . VFD (visual field defect) 01/31/2011  . Cannot sleep 01/09/2011  . Essential (primary) hypertension 12/09/2007  . Fibrositis 12/09/2007  . Avitaminosis D 04/10/2007    Jeral Pinch PT  02/10/2017, 11:47 AM  [facility] [place], [place], [address] Phone: [phone]   Fax:  [phone]  Name: Verneda Hollopeter MRN: [id number] Date of Birth: 10/28/1945

## 2017-02-11 ENCOUNTER — Encounter: Payer: Medicare Other | Admitting: Physical Therapy

## 2017-02-12 ENCOUNTER — Encounter: Payer: Medicare Other | Admitting: Physical Therapy

## 2017-02-19 ENCOUNTER — Ambulatory Visit (INDEPENDENT_AMBULATORY_CARE_PROVIDER_SITE_OTHER): Payer: Medicare Other | Admitting: Physical Therapy

## 2017-02-19 ENCOUNTER — Encounter: Payer: Self-pay | Admitting: Physical Therapy

## 2017-02-19 DIAGNOSIS — R293 Abnormal posture: Secondary | ICD-10-CM

## 2017-02-19 DIAGNOSIS — M6281 Muscle weakness (generalized): Secondary | ICD-10-CM | POA: Diagnosis not present

## 2017-02-19 DIAGNOSIS — R29898 Other symptoms and signs involving the musculoskeletal system: Secondary | ICD-10-CM | POA: Diagnosis not present

## 2017-02-19 NOTE — Therapy (Signed)
[place], [place], [address] Phone: [phone]   Fax:  [phone]  Physical Therapy Treatment  Patient Details  Name: Kristina Shea MRN: [id number] Date of Birth: February 02, 1946 Referring Provider: Dr Darene Lamer   Encounter Date: 02/19/2017  PT End of Session - 02/19/17 1425    Visit Number  15    Number of Visits  18    Date for PT Re-Evaluation  03/10/17    PT Start Time  1426    PT Stop Time  1528    PT Time Calculation (min)  62 min    Activity Tolerance  Patient tolerated treatment well       Past Medical History:  Diagnosis Date  . Arthritis   . Diabetes mellitus without complication ([place])    type 2  . Fibromyalgia   . History of hiatal hernia   . Hypertension   . IBS (irritable bowel syndrome)   . Pneumonia   . Sleep apnea    many years ago, does not use cpap    Past Surgical History:  Procedure Laterality Date  . ABDOMINAL HYSTERECTOMY    . COLONOSCOPY    . EYE SURGERY Bilateral    cataract surgery with lens implant  . FRACTURE SURGERY     tendon surgery left little finger  . ORIF HUMERUS FRACTURE Right 11/17/2014   Procedure: OPEN REDUCTION INTERNAL FIXATION (ORIF) PROXIMAL HUMERUS FRACTURE;  Surgeon: Tania Ade, MD;  Location: [facility];  Service: Orthopedics;  Laterality: Right;  Open reduction internal fixation proximal humerus fracture right shoulder  . TONSILLECTOMY      There were no vitals filed for this visit.  Subjective Assessment - 02/19/17 1426    Subjective  Pt reports that the cold bothers her fibromyalgia more than her back.     Patient Stated Goals  walk better, and perform house activites easier.     Currently in Pain?  Yes    Pain Score  6     Pain Location  Thoracic    Pain Orientation  Right    Pain Descriptors / Indicators  Aching;Shooting    Pain Type  Chronic pain    Pain Onset  More than a month ago    Pain Frequency  Constant    Aggravating  Factors   bending forward still bothers her but it is better    Pain Relieving Factors  stretches and exercise         [facility] PT Assessment - 02/19/17 0001      Assessment   Medical Diagnosis  cervical and lumbar DDD    Referring Provider  Dr Ashby Dawes Adult PT Treatment/Exercise - 02/19/17 0001      Lumbar Exercises: Stretches   Lower Trunk Rotation  5 reps;20 seconds each side      Lumbar Exercises: Aerobic   Stationary Bike  Nustep L5x7'      Lumbar Exercises: Supine   Ab Set  20 reps with LE in table top position, overhead reach UE 1#    Bridge  20 reps with knee extension alternating    Other Supine Lumbar Exercises  15x5sec head presses with head in neutral position      Lumbar Exercises: Quadruped   Other Quadruped Lumbar Exercises  2x10 upper body lifts arms by sides      Modalities   Modalities  Electrical  Stimulation;Moist Heat      Moist Heat Therapy   Number Minutes Moist Heat  20 Minutes    Moist Heat Location  Cervical;Lumbar Spine      Electrical Stimulation   Electrical Stimulation Location  thoracic area    Electrical Stimulation Action  IFC    Electrical Stimulation Parameters  to tolerance    Electrical Stimulation Goals  Pain;Tone               PT Short Term Goals - 01/28/17 9798      PT SHORT TERM GOAL #1   Title  I with initial HEP for postural realignment ( 01/21/17)     Status  Achieved      PT SHORT TERM GOAL #2   Title  report =/> 25% reduction in back pain ( 01/21/17)     Status  Achieved      PT SHORT TERM GOAL #3   Title  improve FOTO =/< 55% limited ( 01/21/17)     Status  Achieved      PT SHORT TERM GOAL #4   Title  improve REEDCO posture score =/> 65/100 ( 01/21/17)     Status  On-going        PT Long Term Goals - 02/19/17 1428      PT LONG TERM GOAL #1   Title  I with advanced HEP to include gentle resistance with bands and body weight ( 03/10/17)     Status  On-going      PT LONG  TERM GOAL #2   Title  report =/> 50% improvement in back pain to allow her to perform household chores easier ( 03/10/17)     Status  On-going      PT LONG TERM GOAL #3   Title  improve REEDCO posture score =/> 80/100 to allow for upright posture with walking ( 03/10/17)     Status  On-going      PT LONG TERM GOAL #4   Title  improve FOTO =/< 48% limited, CK level ( 02/04/17)     Status  Achieved      PT LONG TERM GOAL #5   Title  demo bilat hip abduction =/> 5-/5 ( 03/10/17)     Status  Partially Met            Plan - 02/19/17 1501    Clinical Impression Statement  [name] is having a slight flare up of her fibromyalagia so she has some generalized soreness.  Continues to have tenderness in her thoracic area.  She presented to the clinic with improved upright posturing without cueing. She has not met any new goals.     Rehab Potential  Excellent    PT Frequency  1x / week    PT Duration  4 weeks    PT Treatment/Interventions  Moist Heat;Ultrasound;Therapeutic exercise;Dry needling;Manual techniques;Neuromuscular re-education;Cryotherapy;Electrical Stimulation;Patient/family education;Passive range of motion    PT Next Visit Plan  focus on cervical and thoracic aligntment.     Consulted and Agree with Plan of Care  Patient       Patient will benefit from skilled therapeutic intervention in order to improve the following deficits and impairments:  Difficulty walking, Increased muscle spasms, Pain, Improper body mechanics, Postural dysfunction, Decreased strength  Visit Diagnosis: Abnormal posture  Other symptoms and signs involving the musculoskeletal system  Muscle weakness (generalized)     Problem List Patient Active Problem List   Diagnosis Date Noted  . S/P ORIF (open  reduction internal fixation) right proximal humeral fracture 11/17/2014  . Fracture of humerus, proximal, right, closed 11/16/2014  . Closed fracture of humerus, upper end 11/09/2014  . Osteoporosis  04/21/2014  . Facet arthritis of lumbar region ([place]) 04/12/2014  . Scoliosis (and kyphoscoliosis), idiopathic 03/28/2014  . Left cervical radiculopathy 03/28/2014  . Fibromyalgia 03/28/2014  . Foot pain 02/09/2013  . Intracranial subdural hematoma ([place]) 01/06/2013  . Absolute anemia 01/06/2013  . Narcotic drug use 10/15/2012  . Impaired renal function 07/16/2012  . DDD (degenerative disc disease), cervical 07/16/2012  . Gout 06/15/2012  . HLD (hyperlipidemia) 01/02/2012  . Carotid arterial disease ([place]) 01/02/2012  . Anxiety 01/02/2012  . Carotid artery disease ([place]) 01/02/2012  . Scoliosis 01/02/2012  . Calcification of coronary artery 11/02/2011  . Diabetes mellitus, type 2 ([place]) 08/30/2011  . Polypharmacy 08/30/2011  . Abdominal skin ptosis 08/30/2011  . Type 2 diabetes mellitus ([place]) 08/30/2011  . Blurred vision 01/31/2011  . VFD (visual field defect) 01/31/2011  . Cannot sleep 01/09/2011  . Essential (primary) hypertension 12/09/2007  . Fibrositis 12/09/2007  . Avitaminosis D 04/10/2007    Jeral Pinch PT  02/19/2017, 3:09 PM  [facility] [place], [place], [address] Phone: [phone]   Fax:  [phone]  Name: Kimberli Winne MRN: [id number] Date of Birth: [birth year]

## 2017-02-25 ENCOUNTER — Encounter: Payer: Self-pay | Admitting: Physical Therapy

## 2017-02-25 ENCOUNTER — Ambulatory Visit (INDEPENDENT_AMBULATORY_CARE_PROVIDER_SITE_OTHER): Payer: Medicare Other | Admitting: Physical Therapy

## 2017-02-25 DIAGNOSIS — R293 Abnormal posture: Secondary | ICD-10-CM | POA: Diagnosis not present

## 2017-02-25 DIAGNOSIS — R29898 Other symptoms and signs involving the musculoskeletal system: Secondary | ICD-10-CM

## 2017-02-25 DIAGNOSIS — M6281 Muscle weakness (generalized): Secondary | ICD-10-CM

## 2017-02-25 NOTE — Therapy (Signed)
[place], [place], [address] Phone: [phone]   Fax:  [phone]  Physical Therapy Treatment  Patient Details  Name: Kristina Shea MRN: [id number] Date of Birth: 1945-04-14 Referring Provider: Dr Darene Lamer   Encounter Date: 02/25/2017  PT End of Session - 02/25/17 1140    Visit Number  16    Number of Visits  18    Date for PT Re-Evaluation  03/10/17    PT Start Time  1140    PT Stop Time  1244    PT Time Calculation (min)  64 min    Activity Tolerance  Patient tolerated treatment well       Past Medical History:  Diagnosis Date  . Arthritis   . Diabetes mellitus without complication ([place])    type 2  . Fibromyalgia   . History of hiatal hernia   . Hypertension   . IBS (irritable bowel syndrome)   . Pneumonia   . Sleep apnea    many years ago, does not use cpap    Past Surgical History:  Procedure Laterality Date  . ABDOMINAL HYSTERECTOMY    . COLONOSCOPY    . EYE SURGERY Bilateral    cataract surgery with lens implant  . FRACTURE SURGERY     tendon surgery left little finger  . ORIF HUMERUS FRACTURE Right 11/17/2014   Procedure: OPEN REDUCTION INTERNAL FIXATION (ORIF) PROXIMAL HUMERUS FRACTURE;  Surgeon: Tania Ade, MD;  Location: [facility];  Service: Orthopedics;  Laterality: Right;  Open reduction internal fixation proximal humerus fracture right shoulder  . TONSILLECTOMY      There were no vitals filed for this visit.  Subjective Assessment - 02/25/17 1143    Subjective  Pt reports she is stressed due to some family issues. She hasn't been able to sleep very much due to this.     Patient Stated Goals  walk better, and perform house activites easier.     Currently in Pain?  No/denies very good for her.                       [facility] Adult PT Treatment/Exercise - 02/25/17 0001      Lumbar Exercises: Aerobic   Stationary Bike  Nustep L5x7'      Lumbar  Exercises: Seated   Other Seated Lumbar Exercises  upper body shifts to the left to get in neutral alignment    Other Seated Lumbar Exercises  3x5 seated in good alignment       Lumbar Exercises: Supine   Other Supine Lumbar Exercises  stretching over small wedge with arms overhead and legs straight, therapist assist stretch, then over yoga eggs with bilat shoulder flexion stretching.       Modalities   Modalities  Electrical Stimulation;Moist Heat      Moist Heat Therapy   Number Minutes Moist Heat  20 Minutes    Moist Heat Location  Cervical;Lumbar Spine      Electrical Stimulation   Electrical Stimulation Location  thoracic area    Electrical Stimulation Action  IFC    Electrical Stimulation Parameters   to tolerance    Electrical Stimulation Goals  Pain;Tone               PT Short Term Goals - 01/28/17 0943      PT SHORT TERM GOAL #1   Title  I with initial HEP for postural realignment ( 01/21/17)  Status  Achieved      PT SHORT TERM GOAL #2   Title  report =/> 25% reduction in back pain ( 01/21/17)     Status  Achieved      PT SHORT TERM GOAL #3   Title  improve FOTO =/< 55% limited ( 01/21/17)     Status  Achieved      PT SHORT TERM GOAL #4   Title  improve REEDCO posture score =/> 65/100 ( 01/21/17)     Status  On-going        PT Long Term Goals - 02/19/17 1428      PT LONG TERM GOAL #1   Title  I with advanced HEP to include gentle resistance with bands and body weight ( 03/10/17)     Status  On-going      PT LONG TERM GOAL #2   Title  report =/> 50% improvement in back pain to allow her to perform household chores easier ( 03/10/17)     Status  On-going      PT LONG TERM GOAL #3   Title  improve REEDCO posture score =/> 80/100 to allow for upright posture with walking ( 03/10/17)     Status  On-going      PT LONG TERM GOAL #4   Title  improve FOTO =/< 48% limited, CK level ( 02/04/17)     Status  Achieved      PT LONG TERM GOAL #5    Title  demo bilat hip abduction =/> 5-/5 ( 03/10/17)     Status  Partially Met            Plan - 02/25/17 1226    Clinical Impression Statement  Kristina Shea does very well with supine stretches over bolsters and tolerated yoga eggs toda.  Did have difficulty isolating thoracic motion to the left.  Required verbal, tactile and visual cues to correct into neutral posture in sitiing. Making progerss to her goals, less pain today.     Rehab Potential  Excellent    PT Frequency  1x / week    PT Duration  4 weeks    PT Treatment/Interventions  Moist Heat;Ultrasound;Therapeutic exercise;Dry needling;Manual techniques;Neuromuscular re-education;Cryotherapy;Electrical Stimulation;Patient/family education;Passive range of motion    PT Next Visit Plan  focus on cervical and thoracic aligntment.     Consulted and Agree with Plan of Care  Patient       Patient will benefit from skilled therapeutic intervention in order to improve the following deficits and impairments:  Difficulty walking, Increased muscle spasms, Pain, Improper body mechanics, Postural dysfunction, Decreased strength  Visit Diagnosis: Abnormal posture  Other symptoms and signs involving the musculoskeletal system  Muscle weakness (generalized)     Problem List Patient Active Problem List   Diagnosis Date Noted  . S/P ORIF (open reduction internal fixation) right proximal humeral fracture 11/17/2014  . Fracture of humerus, proximal, right, closed 11/16/2014  . Closed fracture of humerus, upper end 11/09/2014  . Osteoporosis 04/21/2014  . Facet arthritis of lumbar region ([place]) 04/12/2014  . Scoliosis (and kyphoscoliosis), idiopathic 03/28/2014  . Left cervical radiculopathy 03/28/2014  . Fibromyalgia 03/28/2014  . Foot pain 02/09/2013  . Intracranial subdural hematoma ([place]) 01/06/2013  . Absolute anemia 01/06/2013  . Narcotic drug use 10/15/2012  . Impaired renal function 07/16/2012  . DDD (degenerative disc disease),  cervical 07/16/2012  . Gout 06/15/2012  . HLD (hyperlipidemia) 01/02/2012  . Carotid arterial disease ([place]) 01/02/2012  . Anxiety 01/02/2012  .  Carotid artery disease ([place]) 01/02/2012  . Scoliosis 01/02/2012  . Calcification of coronary artery 11/02/2011  . Diabetes mellitus, type 2 ([place]) 08/30/2011  . Polypharmacy 08/30/2011  . Abdominal skin ptosis 08/30/2011  . Type 2 diabetes mellitus ([place]) 08/30/2011  . Blurred vision 01/31/2011  . VFD (visual field defect) 01/31/2011  . Cannot sleep 01/09/2011  . Essential (primary) hypertension 12/09/2007  . Fibrositis 12/09/2007  . Avitaminosis D 04/10/2007    Jeral Pinch PT 02/25/2017, 12:29 PM  [facility] [place], [place], [address] Phone: [phone]   Fax:  [phone]  Name: Kristina Shea MRN: [id number] Date of Birth: 1945-06-24

## 2017-02-27 ENCOUNTER — Other Ambulatory Visit: Payer: Self-pay

## 2017-03-04 ENCOUNTER — Encounter: Payer: Medicare Other | Admitting: Physical Therapy

## 2017-03-06 ENCOUNTER — Encounter: Payer: Medicare Other | Admitting: Physical Therapy

## 2017-03-11 ENCOUNTER — Encounter: Payer: Medicare Other | Admitting: Physical Therapy

## 2017-03-13 ENCOUNTER — Ambulatory Visit (INDEPENDENT_AMBULATORY_CARE_PROVIDER_SITE_OTHER): Payer: Medicare Other | Admitting: Physical Therapy

## 2017-03-13 DIAGNOSIS — M6281 Muscle weakness (generalized): Secondary | ICD-10-CM | POA: Diagnosis not present

## 2017-03-13 DIAGNOSIS — R293 Abnormal posture: Secondary | ICD-10-CM | POA: Diagnosis not present

## 2017-03-13 DIAGNOSIS — R29898 Other symptoms and signs involving the musculoskeletal system: Secondary | ICD-10-CM

## 2017-03-13 NOTE — Therapy (Signed)
[place], [place], [address] Phone: [phone]   Fax:  [phone]  Physical Therapy Treatment  Patient Details  Name: Kristina Shea MRN: [id number] Date of Birth: 10/02/1945 Referring Provider: Dr Dianah Field   Encounter Date: 03/13/2017  PT End of Session - 03/13/17 1406    Visit Number  17    Number of Visits  18    Date for PT Re-Evaluation  03/10/17    PT Start Time  7209    PT Stop Time  1503    PT Time Calculation (min)  58 min    Activity Tolerance  Patient tolerated treatment well       Past Medical History:  Diagnosis Date  . Arthritis   . Diabetes mellitus without complication ([place])    type 2  . Fibromyalgia   . History of hiatal hernia   . Hypertension   . IBS (irritable bowel syndrome)   . Pneumonia   . Sleep apnea    many years ago, does not use cpap    Past Surgical History:  Procedure Laterality Date  . ABDOMINAL HYSTERECTOMY    . COLONOSCOPY    . EYE SURGERY Bilateral    cataract surgery with lens implant  . FRACTURE SURGERY     tendon surgery left little finger  . ORIF HUMERUS FRACTURE Right 11/17/2014   Procedure: OPEN REDUCTION INTERNAL FIXATION (ORIF) PROXIMAL HUMERUS FRACTURE;  Surgeon: Tania Ade, MD;  Location: [facility];  Service: Orthopedics;  Laterality: Right;  Open reduction internal fixation proximal humerus fracture right shoulder  . TONSILLECTOMY      There were no vitals filed for this visit.  Subjective Assessment - 03/13/17 1407    Subjective  Kristina Shea reports she is ready for discharge, wasn't able to do her HEP everyday this past week due to personal issues at home during the snow.          [facility] PT Assessment - 03/13/17 0001      Assessment   Medical Diagnosis  cervical and lumbar DDD    Referring Provider  Dr Dianah Field    Onset Date/Surgical Date  03/11/16      Observation/Other Assessments   Focus on Therapeutic Outcomes  (FOTO)   37% limited      Posture/Postural Control   Posture Comments  5'2" 1/4      AROM   Cervical Flexion  67    Cervical Extension  75    Cervical - Right Rotation  80    Cervical - Left Rotation  73    Lumbar Flexion  to floor    Lumbar Extension  50% present    Lumbar - Right Rotation  WNL    Lumbar - Left Rotation  40% with tightness      Strength   Right Hip ABduction  -- 5-/5    Left Hip ABduction  -- 5-/5                  [facility] Adult PT Treatment/Exercise - 03/13/17 0001      Lumbar Exercises: Aerobic   Stationary Bike  Nustep L5x7'      Lumbar Exercises: Supine   Other Supine Lumbar Exercises  stretching over small wedge with arms overhead and legs straight, therapist assist stretch, then over yoga eggs with bilat shoulder flexion stretching.    Reviewed current HEP      Modalities   Modalities  Electrical Stimulation;Moist Heat  Moist Heat Therapy   Number Minutes Moist Heat  20 Minutes    Moist Heat Location  Cervical;Lumbar Spine      Electrical Stimulation   Electrical Stimulation Location  thoracic area    Electrical Stimulation Action  IFC    Electrical Stimulation Parameters  to tolerance    Electrical Stimulation Goals  Pain;Tone               PT Short Term Goals - 03-14-17 1736      PT SHORT TERM GOAL #1   Title  I with initial HEP for postural realignment ( 01/21/17)     Status  Achieved      PT SHORT TERM GOAL #2   Title  report =/> 25% reduction in back pain ( 01/21/17)     Status  Achieved      PT SHORT TERM GOAL #3   Title  improve FOTO =/< 55% limited ( 01/21/17)     Status  Achieved      PT SHORT TERM GOAL #4   Title  improve REEDCO posture score =/> 65/100 ( 01/21/17)     Status  Not Met scored 64% limited        PT Long Term Goals - 2017-03-14 1428      PT LONG TERM GOAL #1   Title  I with advanced HEP to include gentle resistance with bands and body weight ( 03/10/17)     Status  Achieved       PT LONG TERM GOAL #2   Title  report =/> 50% improvement in back pain to allow her to perform household chores easier ( 03/10/17)     Status  Achieved 50-60% improved      PT LONG TERM GOAL #3   Status  Not Met 64/100      PT LONG TERM GOAL #4   Title  improve FOTO =/< 48% limited, CK level ( 02/04/17)     Status  Achieved scored 37% limited, CJ level      PT LONG TERM GOAL #5   Title  demo bilat hip abduction =/> 5-/5 ( 03/10/17)     Status  Achieved            Plan - 03-14-2017 1737    Clinical Impression Statement  Kristina Shea has met all but one goal set for her.  She does still have back pain however with her history and diagnosis that is to be expected.  Over the course of treatment she gained about an inch in height and had 50-60% reduction in pain.  She is independent with her HEP and will continue that at home.     PT Next Visit Plan  discharge to HEP     Consulted and Agree with Plan of Care  Patient       Patient will benefit from skilled therapeutic intervention in order to improve the following deficits and impairments:     Visit Diagnosis: Other symptoms and signs involving the musculoskeletal system  Abnormal posture  Muscle weakness (generalized)   [facility] PT PB G-CODES - 2017-03-14 1739    Functional Assessment Tool Used   FOTO, REEDCO and professional judgement     Functional Limitations  Other PT primary    Other PT Primary Goal Status (Z6109)  At least 40 percent but less than 60 percent impaired, limited or restricted    Other PT Primary Discharge Status (U0454)  At least 20 percent but less than 40 percent  impaired, limited or restricted       Problem List Patient Active Problem List   Diagnosis Date Noted  . S/P ORIF (open reduction internal fixation) right proximal humeral fracture 11/17/2014  . Fracture of humerus, proximal, right, closed 11/16/2014  . Closed fracture of humerus, upper end 11/09/2014  . Osteoporosis 04/21/2014  . Facet arthritis of  lumbar region ([place]) 04/12/2014  . Scoliosis (and kyphoscoliosis), idiopathic 03/28/2014  . Left cervical radiculopathy 03/28/2014  . Fibromyalgia 03/28/2014  . Foot pain 02/09/2013  . Intracranial subdural hematoma ([place]) 01/06/2013  . Absolute anemia 01/06/2013  . Narcotic drug use 10/15/2012  . Impaired renal function 07/16/2012  . DDD (degenerative disc disease), cervical 07/16/2012  . Gout 06/15/2012  . HLD (hyperlipidemia) 01/02/2012  . Carotid arterial disease ([place]) 01/02/2012  . Anxiety 01/02/2012  . Carotid artery disease ([place]) 01/02/2012  . Scoliosis 01/02/2012  . Calcification of coronary artery 11/02/2011  . Diabetes mellitus, type 2 ([place]) 08/30/2011  . Polypharmacy 08/30/2011  . Abdominal skin ptosis 08/30/2011  . Type 2 diabetes mellitus ([place]) 08/30/2011  . Blurred vision 01/31/2011  . VFD (visual field defect) 01/31/2011  . Cannot sleep 01/09/2011  . Essential (primary) hypertension 12/09/2007  . Fibrositis 12/09/2007  . Avitaminosis D 04/10/2007    Jeral Pinch PT  03/13/2017, 5:40 PM  [facility] [place], [place], [address] Phone: [phone]   Fax:  [phone]  Name: Kristina Shea MRN: [id number] Date of Birth: 1945-04-03   PHYSICAL THERAPY DISCHARGE SUMMARY  Visits from Start of Care: 17  Current functional level related to goals / functional outcomes: See above for current measurements   Remaining deficits: Pain in her mid back, however it is improved with therapy.    Education / Equipment: HEP Plan: Patient agrees to discharge.  Patient goals were partially met.All but one goal were met.  Patient is being discharged due to being pleased with the current functional level.  ?????    Jeral Pinch, PT 03/13/17 5:42 PM

## 2017-08-06 ENCOUNTER — Other Ambulatory Visit: Payer: Self-pay | Admitting: Sports Medicine

## 2017-08-06 DIAGNOSIS — M5030 Other cervical disc degeneration, unspecified cervical region: Secondary | ICD-10-CM

## 2018-01-13 ENCOUNTER — Emergency Department (INDEPENDENT_AMBULATORY_CARE_PROVIDER_SITE_OTHER): Payer: Medicare Other

## 2018-01-13 ENCOUNTER — Emergency Department (INDEPENDENT_AMBULATORY_CARE_PROVIDER_SITE_OTHER)
Admission: EM | Admit: 2018-01-13 | Discharge: 2018-01-13 | Disposition: A | Discharge status: Home / Self Care | Payer: Medicare Other | Source: Home / Self Care | Attending: Family Medicine | Admitting: Family Medicine

## 2018-01-13 ENCOUNTER — Encounter: Payer: Self-pay | Admitting: *Deleted

## 2018-01-13 ENCOUNTER — Other Ambulatory Visit: Payer: Self-pay

## 2018-01-13 DIAGNOSIS — W19XXXA Unspecified fall, initial encounter: Secondary | ICD-10-CM | POA: Diagnosis not present

## 2018-01-13 DIAGNOSIS — S82831A Other fracture of upper and lower end of right fibula, initial encounter for closed fracture: Secondary | ICD-10-CM

## 2018-01-13 MED ORDER — HYDROCODONE-ACETAMINOPHEN 5-325 MG PO TABS: 1.0000 | ORAL_TABLET | Freq: Four times a day (QID) | ORAL | 0 refills | Status: DC | PRN

## 2018-01-13 NOTE — ED Triage Notes (Signed)
Pt c/o RT ankle pain and swelling post fall last night. No OTC meds.

## 2018-01-13 NOTE — ED Provider Notes (Signed)
[facility]    CSN: [id number] Arrival date & time: 01/13/18  1724     History   Chief Complaint Chief Complaint  Patient presents with  . Ankle Pain    HPI Kristina Shea is a 72 y.o. female.   During the night 1.5 days ago, patient tripped over a dog toy in her bedroom.  She twisted her right ankle/foot and felt/heard a popping sensation followed by pain/swelling.  The history is provided by the patient.  Ankle Pain  Location:  Ankle and foot Time since incident:  2 hours Injury: yes   Mechanism of injury: fall   Fall:    Fall occurred:  Standing Ankle location:  R ankle Foot location:  R foot Pain details:    Quality:  Aching   Radiates to: distal right lower leg.   Severity:  Severe   Onset quality:  Sudden   Duration:  2 days   Timing:  Constant   Progression:  Unchanged Chronicity:  New Prior injury to area:  No Relieved by:  Nothing Worsened by:  Bearing weight, flexion and extension Ineffective treatments:  Ice Associated symptoms: decreased ROM, stiffness and swelling   Associated symptoms: no back pain, no muscle weakness, no numbness and no tingling     Past Medical History:  Diagnosis Date  . Arthritis   . Diabetes mellitus without complication ([facility])    type 2  . Fibromyalgia   . History of hiatal hernia   . Hypertension   . IBS (irritable bowel syndrome)   . Pneumonia   . Sleep apnea    many years ago, does not use cpap    Patient Active Problem List   Diagnosis Date Noted  . S/P ORIF (open reduction internal fixation) right proximal humeral fracture 11/17/2014  . Fracture of humerus, proximal, right, closed 11/16/2014  . Closed fracture of humerus, upper end 11/09/2014  . Osteoporosis 04/21/2014  . Facet arthritis of lumbar region 04/12/2014  . Scoliosis (and kyphoscoliosis), idiopathic 03/28/2014  . Left cervical radiculopathy 03/28/2014  . Fibromyalgia 03/28/2014  . Foot pain 02/09/2013  . Intracranial subdural  hematoma ([facility]) 01/06/2013  . Absolute anemia 01/06/2013  . Narcotic drug use 10/15/2012  . Impaired renal function 07/16/2012  . DDD (degenerative disc disease), cervical 07/16/2012  . Gout 06/15/2012  . HLD (hyperlipidemia) 01/02/2012  . Carotid arterial disease ([facility]) 01/02/2012  . Anxiety 01/02/2012  . Carotid artery disease ([facility]) 01/02/2012  . Scoliosis 01/02/2012  . Calcification of coronary artery 11/02/2011  . Diabetes mellitus, type 2 ([facility]) 08/30/2011  . Polypharmacy 08/30/2011  . Abdominal skin ptosis 08/30/2011  . Type 2 diabetes mellitus ([facility]) 08/30/2011  . Blurred vision 01/31/2011  . VFD (visual field defect) 01/31/2011  . Cannot sleep 01/09/2011  . Essential (primary) hypertension 12/09/2007  . Fibrositis 12/09/2007  . Avitaminosis D 04/10/2007    Past Surgical History:  Procedure Laterality Date  . ABDOMINAL HYSTERECTOMY    . COLONOSCOPY    . EYE SURGERY Bilateral    cataract surgery with lens implant  . FRACTURE SURGERY     tendon surgery left little finger  . ORIF HUMERUS FRACTURE Right 11/17/2014   Procedure: OPEN REDUCTION INTERNAL FIXATION (ORIF) PROXIMAL HUMERUS FRACTURE;  Surgeon: Jones Broom, MD;  [facility];  Service: Orthopedics;  Laterality: Right;  Open reduction internal fixation proximal humerus fracture right shoulder  . TONSILLECTOMY      OB History   None      Home Medications  Prior to Admission medications   Medication Sig Start Date End Date Taking? Authorizing Provider  cyclobenzaprine (FLEXERIL) 10 MG tablet Take 10 mg by mouth 3 (three) times daily as needed for muscle spasms. TAKE 1 TABLET THREE TIMES A DAY AS NEEDED FOR MUSCLE SPASM 04/07/14   [provider]  diazepam (VALIUM) 5 MG tablet TAKE 1 TABLET 3 TIMES DAILY 03/09/15   [provider]  HYDROcodone-acetaminophen (NORCO/VICODIN) 5-325 MG tablet Take 1 tablet by mouth every 6 (six) hours as needed for moderate pain or severe pain. 01/13/18    Lattie Haw, MD  metFORMIN (GLUCOPHAGE) 500 MG tablet Take 500 mg by mouth 2 (two) times daily with a meal.  03/15/14   [provider]  potassium chloride SA (KLOR-CON M20) 20 MEQ tablet Take 2 tablets (40 mEq total) by mouth daily. 11/18/14   Jiles Harold, PA-C  pravastatin (PRAVACHOL) 20 MG tablet Take 20 mg by mouth daily.  03/15/14   [provider]  topiramate (TOPAMAX) 100 MG tablet Take 100 mg by mouth 2 (two) times daily.  03/15/14   [provider]  venlafaxine XR (EFFEXOR-XR) 75 MG 24 hr capsule TAKE 1 CAPSULE DAILY WITH BREAKFAST 08/06/17   Monica Becton, MD  zolpidem (AMBIEN) 10 MG tablet TAKE 1 TABLET AT BEDTIME AS NEEDED FOR SLEEP 02/21/14   [provider]    Family History Family History  Problem Relation Age of Onset  . COPD Mother     Social History Social History   Tobacco Use  . Smoking status: Never Smoker  . Smokeless tobacco: Never Used  Substance Use Topics  . Alcohol use: Yes    Alcohol/week: 0.0 standard drinks    Comment: holidays  . Drug use: No     Allergies   Patient has no known allergies.   Review of Systems Review of Systems  Musculoskeletal: Positive for stiffness. Negative for back pain.  All other systems reviewed and are negative.    Physical Exam Triage Vital Signs ED Triage Vitals  Enc Vitals Group     BP 01/13/18 1746 (!) 151/85     Pulse Rate 01/13/18 1746 86     Resp 01/13/18 1746 18     Temp 01/13/18 1746 98.6 F (37 C)     Temp Source 01/13/18 1746 Oral     SpO2 01/13/18 1746 98 %     Weight 01/13/18 1747 178 lb (80.7 kg)     Height 01/13/18 1747 5\' 4"  (1.626 m)     Head Circumference --      Peak Flow --      Pain Score 01/13/18 1747 10     Pain Loc --      Pain Edu? --      Excl. in GC? --    No data found.  Updated Vital Signs BP (!) 151/85 (BP Location: Right Arm)   Pulse 86   Temp 98.6 F (37 C) (Oral)   Resp 18   Ht 5\' 4"  (1.626 m)   Wt 80.7 kg    SpO2 98%   BMI 30.55 kg/m   Visual Acuity Right Eye Distance:   Left Eye Distance:   Bilateral Distance:    Right Eye Near:   Left Eye Near:    Bilateral Near:     Physical Exam  Constitutional: She appears well-developed and well-nourished. No distress.  HENT:  Head: Atraumatic.  Right Ear: External ear normal.  Left Ear: External ear normal.  Nose: Nose normal.  Mouth/Throat: Oropharynx is clear and moist.  Eyes: Pupils are equal, round, and reactive to light. Conjunctivae are normal.  Neck: Normal range of motion.  Cardiovascular: Normal rate.  Pulmonary/Chest: Effort normal.  Musculoskeletal:       Right ankle: She exhibits decreased range of motion and swelling. She exhibits no ecchymosis, no deformity, no laceration and normal pulse. Tenderness. Lateral malleolus and head of 5th metatarsal tenderness found. Achilles tendon normal.       Right foot: There is decreased range of motion, tenderness, bony tenderness and swelling. There is normal capillary refill, no crepitus, no deformity and no laceration.       Feet:  Neurological: She is alert.  Skin: Skin is warm and dry.  Nursing note and vitals reviewed.    UC Treatments / Results  Labs (all labs ordered are listed, but only abnormal results are displayed) Labs Reviewed - No data to display  EKG None  Radiology Dg Ankle Complete Right  Result Date: 01/13/2018 CLINICAL DATA:  Fall with ankle pain EXAM: RIGHT ANKLE - COMPLETE 3+ VIEW COMPARISON:  None. FINDINGS: Diffuse soft tissue swelling. Acute fracture distal shaft of the fibula with 1/4 bone with lateral displacement distal fracture fragment. Ankle mortise is symmetric. IMPRESSION: Acute mildly displaced distal fibula fracture Electronically Signed   By: Jasmine Pang M.D.   On: 01/13/2018 19:08   Dg Foot Complete Right  Result Date: 01/13/2018 CLINICAL DATA:  Fall with pain and swelling EXAM: RIGHT FOOT COMPLETE - 3+ VIEW COMPARISON:  None. FINDINGS:  Acute mildly displaced distal fibular fracture. No fracture or malalignment in the right foot bones. Plantar calcaneal spur IMPRESSION: Acute displaced distal fibular fracture Electronically Signed   By: Jasmine Pang M.D.   On: 01/13/2018 19:07    Procedures Procedures (including critical care time)  Medications Ordered in UC Medications - No data to display  Initial Impression / Assessment and Plan / UC Course  I have reviewed the triage vital signs and the nursing notes.  Pertinent labs & imaging results that were available during my care of the patient were reviewed by me and considered in my medical decision making (see chart for details).    Dispensed Cam Walker.  Rx for Lortab (#15, no refill). Controlled Substance Prescriptions I have consulted the [place] Controlled Substances Registry for this patient, and feel the risk/benefit ratio today is favorable for proceeding with this prescription for a controlled substance.  Followup with Dr. Rodney Langton as soon as possible for fracture management.  Note past history of fractures, osteopenia on bone density scan, and low Vitamin D levels.  PTH was WNL in 2017.  Patient may be a candidate for Tymlos therapy.   Final Clinical Impressions(s) / UC Diagnoses   Final diagnoses:  Closed fracture of distal end of right fibula, unspecified fracture morphology, initial encounter     Discharge Instructions     Minimize weight bearing; use walker if unable to use crutches.  Elevate leg when possible.    ED Prescriptions    Medication Sig Dispense Auth. Provider   HYDROcodone-acetaminophen (NORCO/VICODIN) 5-325 MG tablet Take 1 tablet by mouth every 6 (six) hours as needed for moderate pain or severe pain. 15 tablet Lattie Haw, MD        Lattie Haw, MD 01/14/18 [phone]

## 2018-01-13 NOTE — Discharge Instructions (Addendum)
Minimize weight bearing; use walker if unable to use crutches.  Elevate leg when possible.

## 2018-01-15 ENCOUNTER — Ambulatory Visit (INDEPENDENT_AMBULATORY_CARE_PROVIDER_SITE_OTHER): Payer: Medicare Other

## 2018-01-15 ENCOUNTER — Ambulatory Visit: Payer: Medicare Other | Admitting: Sports Medicine

## 2018-01-15 ENCOUNTER — Encounter: Payer: Self-pay | Admitting: Sports Medicine

## 2018-01-15 DIAGNOSIS — S82831A Other fracture of upper and lower end of right fibula, initial encounter for closed fracture: Principal | ICD-10-CM

## 2018-01-15 DIAGNOSIS — S82301A Unspecified fracture of lower end of right tibia, initial encounter for closed fracture: Secondary | ICD-10-CM | POA: Diagnosis not present

## 2018-01-15 DIAGNOSIS — M7989 Other specified soft tissue disorders: Secondary | ICD-10-CM

## 2018-01-15 DIAGNOSIS — R0781 Pleurodynia: Secondary | ICD-10-CM | POA: Insufficient documentation

## 2018-01-15 DIAGNOSIS — M7752 Other enthesopathy of left foot: Secondary | ICD-10-CM | POA: Diagnosis not present

## 2018-01-15 DIAGNOSIS — R0789 Other chest pain: Secondary | ICD-10-CM | POA: Insufficient documentation

## 2018-01-15 MED ORDER — HYDROCODONE-ACETAMINOPHEN 10-325 MG PO TABS: 1.0000 | ORAL_TABLET | Freq: Three times a day (TID) | ORAL | 0 refills | Status: DC | PRN

## 2018-01-15 NOTE — Assessment & Plan Note (Signed)
Strap with compressive dressing. Continue boot. Fracture does go into the ankle mortise but there is no evidence of instability. Minimize weightbearing.  I billed a fracture code for this encounter, all subsequent visits will be post-op checks in the global period.

## 2018-01-15 NOTE — Assessment & Plan Note (Signed)
Left-sided costal margin pain after a fall. Rib binder/thoracic strapping. Rib series x-rays.

## 2018-01-15 NOTE — Progress Notes (Signed)
 Subjective:    CC: Multiple injuries  HPI: This is a pleasant 72 year old female, she recently had a fall, was seen in urgent care, x-rays showed a spiral fracture through the distal fibula.  She was appropriately placed in a boot and referred to me for further evaluation and definitive treatment.  During her fall she developed a severe pain in the left anterior chest wall at the costal margin.  No hemoptysis, no difficult he breathing.  Symptoms are moderate, persistent.  I reviewed the past medical history, family history, social history, surgical history, and allergies today and no changes were needed.  Please see the problem list section below in epic for further details.  Past Medical History: Past Medical History:  Diagnosis Date  . Arthritis   . Diabetes mellitus without complication ([facility])    type 2  . Fibromyalgia   . History of hiatal hernia   . Hypertension   . IBS (irritable bowel syndrome)   . Pneumonia   . Sleep apnea    many years ago, does not use cpap   Past Surgical History: Past Surgical History:  Procedure Laterality Date  . ABDOMINAL HYSTERECTOMY    . COLONOSCOPY    . EYE SURGERY Bilateral    cataract surgery with lens implant  . FRACTURE SURGERY     tendon surgery left little finger  . ORIF HUMERUS FRACTURE Right 11/17/2014   Procedure: OPEN REDUCTION INTERNAL FIXATION (ORIF) PROXIMAL HUMERUS FRACTURE;  Surgeon: Jones Broom, MD;  Location: [facility];  Service: Orthopedics;  Laterality: Right;  Open reduction internal fixation proximal humerus fracture right shoulder  . TONSILLECTOMY     Social History: Social History   Socioeconomic History  . Marital status: Single    Spouse name: Not on file  . Number of children: Not on file  . Years of education: Not on file  . Highest education level: Not on file  Occupational History  . Not on file  Social Needs  . Financial resource strain: Not on file  . Food insecurity:    Worry: Not on file   Inability: Not on file  . Transportation needs:    Medical: Not on file    Non-medical: Not on file  Tobacco Use  . Smoking status: Never Smoker  . Smokeless tobacco: Never Used  Substance and Sexual Activity  . Alcohol use: Yes    Alcohol/week: 0.0 standard drinks    Comment: holidays  . Drug use: No  . Sexual activity: Not on file  Lifestyle  . Physical activity:    Days per week: Not on file    Minutes per session: Not on file  . Stress: Not on file  Relationships  . Social connections:    Talks on phone: Not on file    Gets together: Not on file    Attends religious service: Not on file    Active member of club or organization: Not on file    Attends meetings of clubs or organizations: Not on file    Relationship status: Not on file  Other Topics Concern  . Not on file  Social History Narrative  . Not on file   Family History: Family History  Problem Relation Age of Onset  . COPD Mother    Allergies: No Known Allergies Medications: See med rec.  Review of Systems: No fevers, chills, night sweats, weight loss, chest pain, or shortness of breath.   Objective:    General: Well Developed, well nourished, and in no  acute distress.  Neuro: Alert and oriented x3, extra-ocular muscles intact, sensation grossly intact.  HEENT: Normocephalic, atraumatic, pupils equal round reactive to light, neck supple, no masses, no lymphadenopathy, thyroid nonpalpable.  Skin: Warm and dry, no rashes. Cardiac: Regular rate and rhythm, no murmurs rubs or gallops, no lower extremity edema.  Respiratory: Clear to auscultation bilaterally. Not using accessory muscles, speaking in full sentences. Right ankle: Swollen, bruised, tender to palpation of the distal fibula.  Neurovascularly intact distally. Chest wall: Tender to palpation at the left costal margin.  No palpable step-offs.  Ankle was strapped with a compressive dressing, chest wall was strapped with a wood binder.  Impression  and Recommendations:    Rib pain on left side Left-sided costal margin pain after a fall. Rib binder/thoracic strapping. Rib series x-rays.  Fracture of distal end of tibia with fibula, right, closed, initial encounter Strap with compressive dressing. Continue boot. Fracture does go into the ankle mortise but there is no evidence of instability. Minimize weightbearing.  I billed a fracture code for this encounter, all subsequent visits will be post-op checks in the global period. ___________________________________________ Ihor Austin. Benjamin Stain, M.D., ABFM., CAQSM. Primary Care and Sports Medicine [facility]  Adjunct Professor of Family Medicine  [employer]

## 2018-02-09 ENCOUNTER — Encounter: Payer: Self-pay | Admitting: Sports Medicine

## 2018-02-09 ENCOUNTER — Ambulatory Visit (INDEPENDENT_AMBULATORY_CARE_PROVIDER_SITE_OTHER): Payer: Medicare Other

## 2018-02-09 ENCOUNTER — Ambulatory Visit (INDEPENDENT_AMBULATORY_CARE_PROVIDER_SITE_OTHER): Payer: Medicare Other | Admitting: Sports Medicine

## 2018-02-09 DIAGNOSIS — S82831A Other fracture of upper and lower end of right fibula, initial encounter for closed fracture: Secondary | ICD-10-CM | POA: Diagnosis not present

## 2018-02-09 DIAGNOSIS — S82301A Unspecified fracture of lower end of right tibia, initial encounter for closed fracture: Secondary | ICD-10-CM

## 2018-02-09 DIAGNOSIS — W19XXXD Unspecified fall, subsequent encounter: Secondary | ICD-10-CM | POA: Diagnosis not present

## 2018-02-09 DIAGNOSIS — S82831D Other fracture of upper and lower end of right fibula, subsequent encounter for closed fracture with routine healing: Secondary | ICD-10-CM | POA: Diagnosis not present

## 2018-02-09 MED ORDER — HYDROCODONE-ACETAMINOPHEN 10-325 MG PO TABS: 1.0000 | ORAL_TABLET | Freq: Three times a day (TID) | ORAL | 0 refills | Status: AC | PRN

## 2018-02-09 NOTE — Assessment & Plan Note (Signed)
3 weeks post fracture. Switch to boot, refilling pain medication. Repeat x-rays today. The fracture did go into the ankle mortise but there was no evidence of mortise instability. Minimize weightbearing. Return in 3 weeks.

## 2018-02-09 NOTE — Progress Notes (Signed)
  Subjective: Kristina Shea returns, she is 3 weeks post spiral fibular fracture into the mortise with no evidence of mortise instability.  She was seen by an outside orthopedist, she is here with persistent pain.  Objective: General: Well-developed, well-nourished, and in no acute distress. Right ankle: Swollen, tender to palpation over the lateral fibula, negative Homans sign.  Neurovascular intact distally, no evidence of cellulitis.  Ankle strap with compressive dressing.  Assessment/plan:   Fracture of distal end of tibia with fibula, right, closed, initial encounter 3 weeks post fracture. Switch to boot, refilling pain medication. Repeat x-rays today. The fracture did go into the ankle mortise but there was no evidence of mortise instability. Minimize weightbearing. Return in 3 weeks.  ___________________________________________ Ihor Austinhomas J. Benjamin Stainhekkekandam, M.D., ABFM., CAQSM. Primary Care and Sports Medicine  [facility]  Adjunct Instructor of Family Medicine  [employer]

## 2018-02-12 ENCOUNTER — Ambulatory Visit: Payer: Medicare Other | Admitting: Sports Medicine

## 2018-03-02 ENCOUNTER — Ambulatory Visit: Payer: Medicare Other | Admitting: Sports Medicine

## 2018-05-03 ENCOUNTER — Other Ambulatory Visit: Payer: Self-pay | Admitting: Sports Medicine

## 2018-11-02 IMAGING — DX DG ANKLE COMPLETE 3+V*L*
3 series · 3 of 3 positions shown · non-contrast
Comparison: None

CLINICAL DATA: Fell last night, injury LEFT ankle, pain and
swelling at lateral malleolus

EXAM:
LEFT ANKLE COMPLETE - 3+ VIEW

[ankle ap]
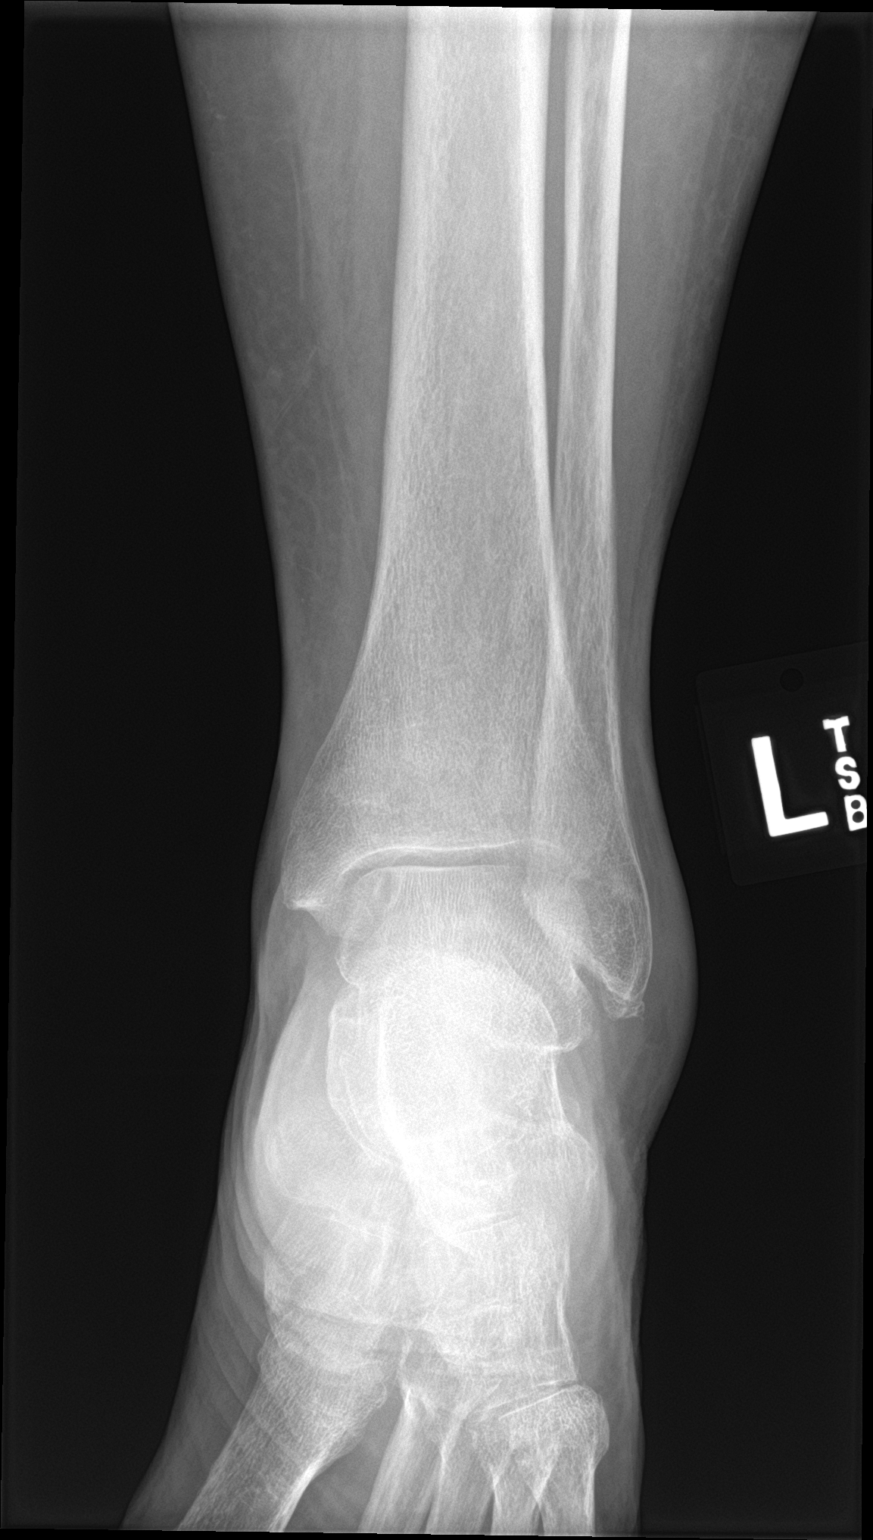

[ankle obl]
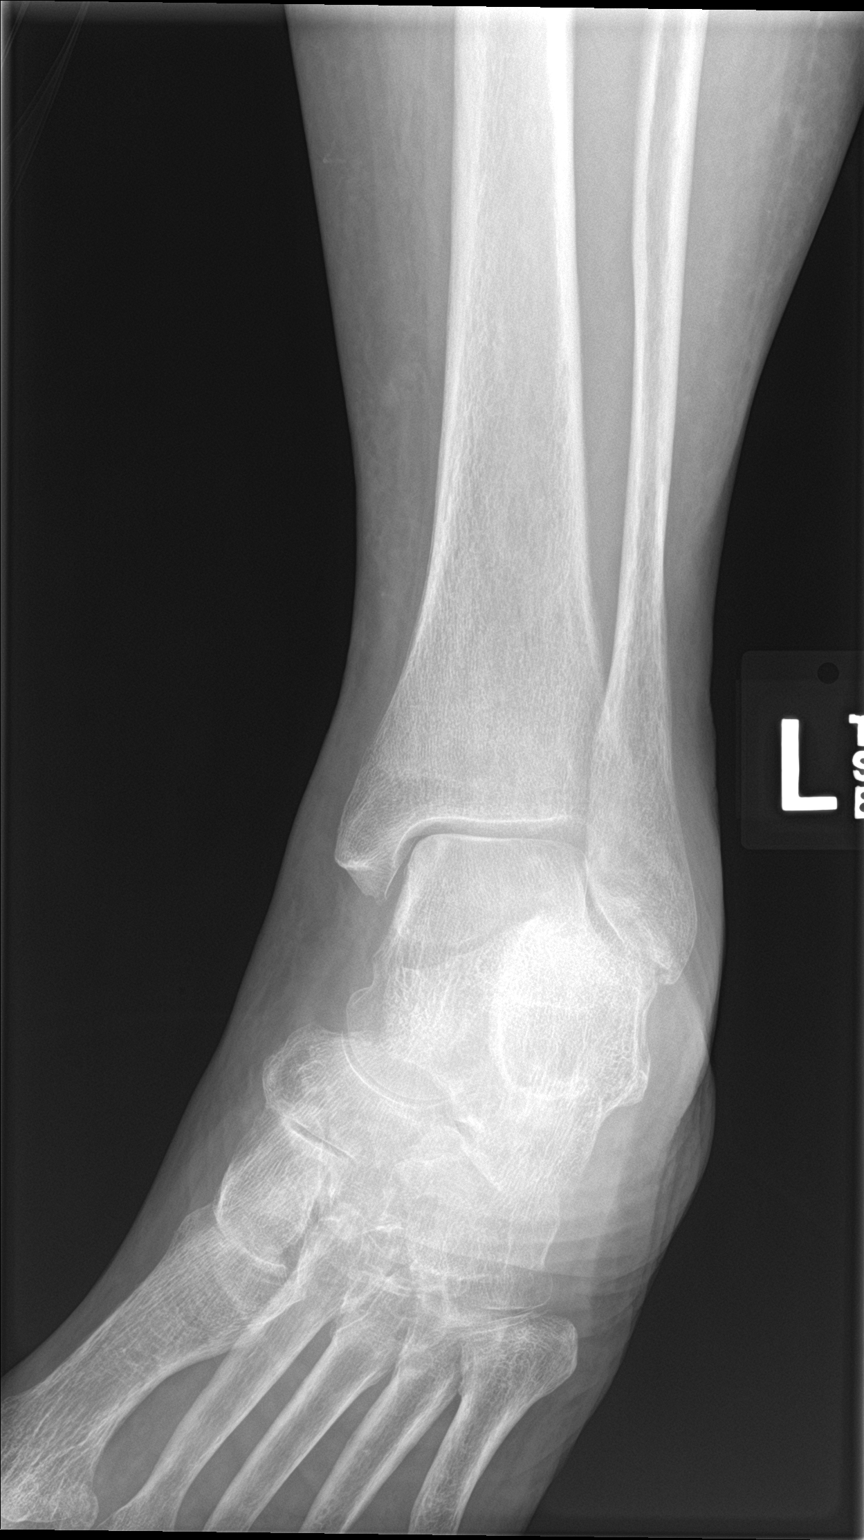

[ankle lat]
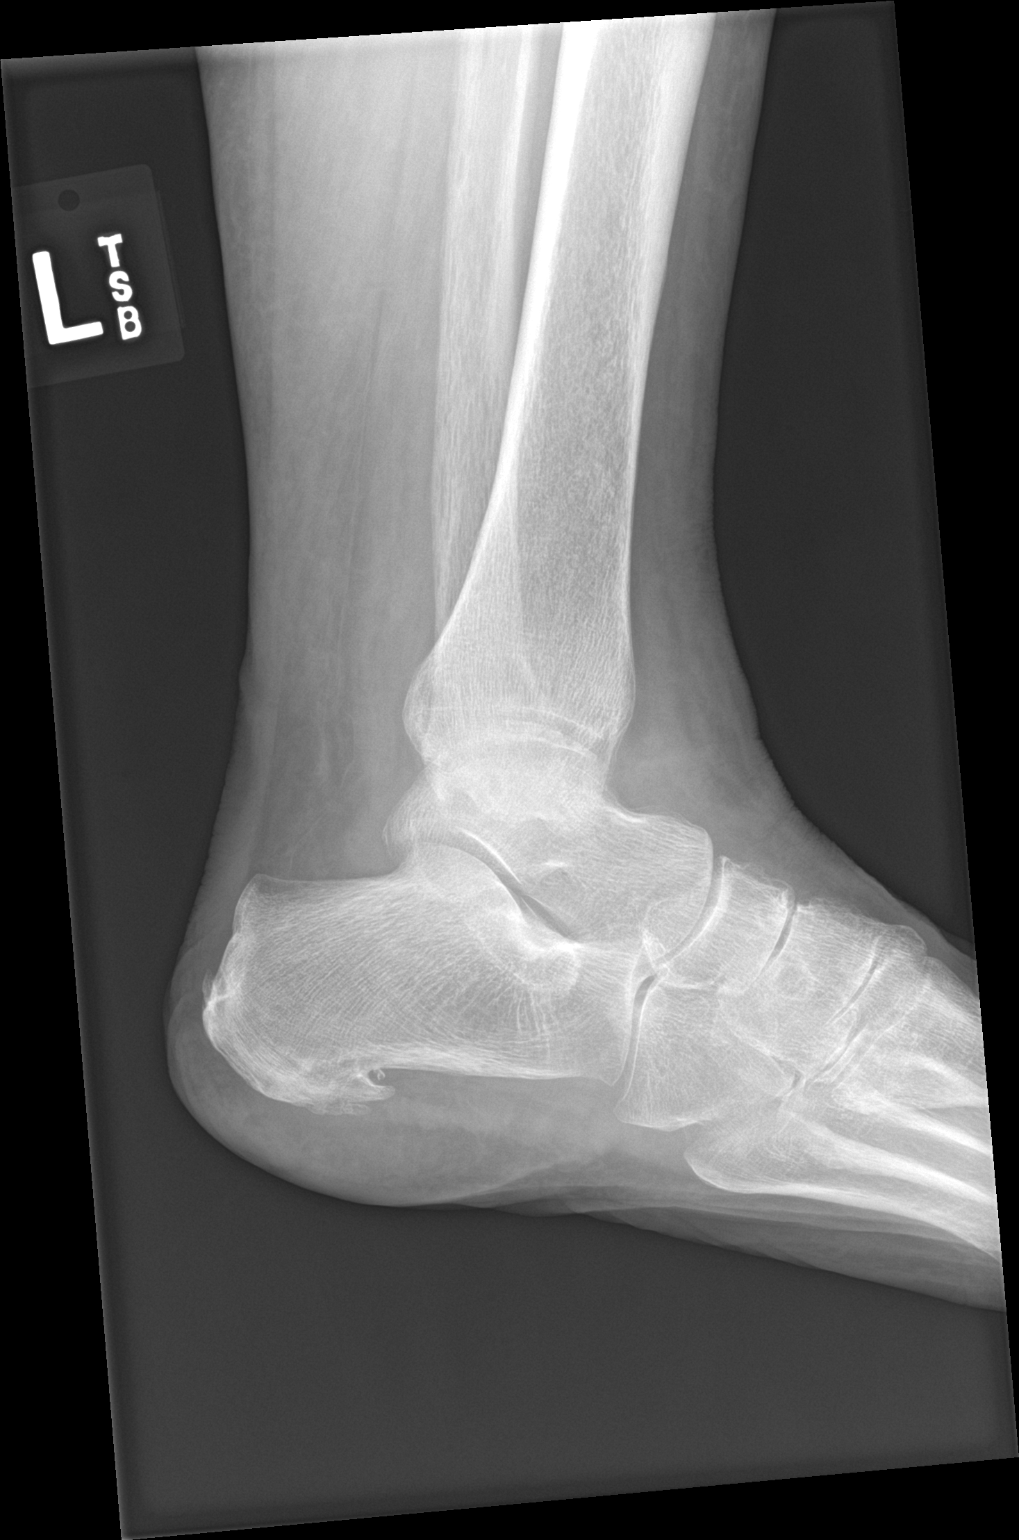

[3 of 3 positions shown; findings below may reference images not displayed]

FINDINGS: Osseous demineralization.

Joint spaces preserved.

Soft tissue swelling laterally.

Question small subchondral cyst at talar dome medially.

Plantar calcaneal spur.

Minimal spurring at tip of lateral malleolus.

No acute fracture, dislocation, or bone destruction.
IMPRESSION: Lateral soft tissue swelling without acute bony abnormalities.

Calcaneal spurring.

## 2019-07-27 ENCOUNTER — Other Ambulatory Visit: Payer: Self-pay | Admitting: Sports Medicine
